# Patient Record
Sex: Female | Born: 1960
Health system: Southern US, Community
[De-identification: ages and names within clinical notes are randomized; demographics above are authoritative.]

## PROBLEM LIST (undated history)

## (undated) DIAGNOSIS — F419 Anxiety disorder, unspecified: Secondary | ICD-10-CM

## (undated) DIAGNOSIS — Z8719 Personal history of other diseases of the digestive system: Secondary | ICD-10-CM

## (undated) DIAGNOSIS — M51369 Other intervertebral disc degeneration, lumbar region without mention of lumbar back pain or lower extremity pain: Secondary | ICD-10-CM

## (undated) DIAGNOSIS — M5136 Other intervertebral disc degeneration, lumbar region: Secondary | ICD-10-CM

## (undated) DIAGNOSIS — I82409 Acute embolism and thrombosis of unspecified deep veins of unspecified lower extremity: Secondary | ICD-10-CM

## (undated) DIAGNOSIS — M1990 Unspecified osteoarthritis, unspecified site: Secondary | ICD-10-CM

## (undated) DIAGNOSIS — K589 Irritable bowel syndrome without diarrhea: Secondary | ICD-10-CM

## (undated) HISTORY — PX: ANKLE SURGERY: SHX546

## (undated) HISTORY — DX: Unspecified osteoarthritis, unspecified site: M19.90

## (undated) HISTORY — DX: Anxiety disorder, unspecified: F41.9

## (undated) HISTORY — DX: Other intervertebral disc degeneration, lumbar region: M51.36

## (undated) HISTORY — DX: Other intervertebral disc degeneration, lumbar region without mention of lumbar back pain or lower extremity pain: M51.369

## (undated) HISTORY — DX: Acute embolism and thrombosis of unspecified deep veins of unspecified lower extremity: I82.409

## (undated) HISTORY — DX: Personal history of other diseases of the digestive system: Z87.19

## (undated) HISTORY — DX: Irritable bowel syndrome, unspecified: K58.9

## (undated) HISTORY — PX: ENDOMETRIAL BIOPSY: SHX622

---

## 1999-09-01 ENCOUNTER — Other Ambulatory Visit: Admission: RE | Admit: 1999-09-01 | Discharge: 1999-09-01 | Payer: Self-pay | Admitting: Obstetrics and Gynecology

## 2001-05-31 DIAGNOSIS — I82409 Acute embolism and thrombosis of unspecified deep veins of unspecified lower extremity: Secondary | ICD-10-CM

## 2001-05-31 HISTORY — DX: Acute embolism and thrombosis of unspecified deep veins of unspecified lower extremity: I82.409

## 2002-03-09 ENCOUNTER — Encounter: Admission: RE | Admit: 2002-03-09 | Discharge: 2002-03-09 | Payer: Self-pay | Admitting: Family Medicine

## 2002-03-09 ENCOUNTER — Encounter: Payer: Self-pay | Admitting: Family Medicine

## 2002-03-25 ENCOUNTER — Emergency Department (HOSPITAL_COMMUNITY): Admission: EM | Admit: 2002-03-25 | Discharge: 2002-03-25 | Payer: Self-pay | Admitting: Emergency Medicine

## 2002-03-25 ENCOUNTER — Encounter: Payer: Self-pay | Admitting: Emergency Medicine

## 2002-03-29 ENCOUNTER — Encounter: Payer: Self-pay | Admitting: Family Medicine

## 2002-03-29 ENCOUNTER — Encounter: Admission: RE | Admit: 2002-03-29 | Discharge: 2002-03-29 | Payer: Self-pay | Admitting: Family Medicine

## 2002-04-23 ENCOUNTER — Encounter: Payer: Self-pay | Admitting: Family Medicine

## 2002-04-23 ENCOUNTER — Encounter: Admission: RE | Admit: 2002-04-23 | Discharge: 2002-04-23 | Payer: Self-pay | Admitting: Family Medicine

## 2002-10-22 ENCOUNTER — Emergency Department (HOSPITAL_COMMUNITY): Admission: EM | Admit: 2002-10-22 | Discharge: 2002-10-22 | Payer: Self-pay | Admitting: Emergency Medicine

## 2002-11-27 ENCOUNTER — Other Ambulatory Visit (HOSPITAL_COMMUNITY): Admission: RE | Admit: 2002-11-27 | Discharge: 2002-12-17 | Payer: Self-pay | Admitting: Psychiatry

## 2003-05-01 ENCOUNTER — Other Ambulatory Visit: Admission: RE | Admit: 2003-05-01 | Discharge: 2003-05-01 | Payer: Self-pay | Admitting: Obstetrics and Gynecology

## 2003-07-17 ENCOUNTER — Encounter: Admission: RE | Admit: 2003-07-17 | Discharge: 2003-07-17 | Payer: Self-pay | Admitting: Family Medicine

## 2004-06-22 ENCOUNTER — Other Ambulatory Visit: Admission: RE | Admit: 2004-06-22 | Discharge: 2004-06-22 | Payer: Self-pay | Admitting: Obstetrics and Gynecology

## 2005-02-26 ENCOUNTER — Encounter: Admission: RE | Admit: 2005-02-26 | Discharge: 2005-02-26 | Payer: Self-pay | Admitting: Family Medicine

## 2005-06-10 ENCOUNTER — Encounter: Admission: RE | Admit: 2005-06-10 | Discharge: 2005-06-10 | Payer: Self-pay | Admitting: Family Medicine

## 2006-02-17 ENCOUNTER — Ambulatory Visit (HOSPITAL_COMMUNITY): Admission: RE | Admit: 2006-02-17 | Discharge: 2006-02-17 | Payer: Self-pay | Admitting: Obstetrics and Gynecology

## 2006-02-17 ENCOUNTER — Encounter (INDEPENDENT_AMBULATORY_CARE_PROVIDER_SITE_OTHER): Payer: Self-pay | Admitting: *Deleted

## 2006-03-30 ENCOUNTER — Ambulatory Visit: Payer: Self-pay | Admitting: Family Medicine

## 2006-06-15 ENCOUNTER — Ambulatory Visit: Payer: Self-pay | Admitting: Family Medicine

## 2006-08-05 ENCOUNTER — Ambulatory Visit: Payer: Self-pay | Admitting: Family Medicine

## 2007-03-28 ENCOUNTER — Ambulatory Visit: Payer: Self-pay | Admitting: Family Medicine

## 2007-04-24 ENCOUNTER — Ambulatory Visit: Payer: Self-pay | Admitting: Family Medicine

## 2008-03-14 ENCOUNTER — Ambulatory Visit: Payer: Self-pay | Admitting: Family Medicine

## 2008-03-18 ENCOUNTER — Ambulatory Visit: Payer: Self-pay | Admitting: Family Medicine

## 2008-05-31 HISTORY — PX: OTHER SURGICAL HISTORY: SHX169

## 2009-03-06 ENCOUNTER — Ambulatory Visit: Payer: Self-pay | Admitting: Family Medicine

## 2009-04-14 ENCOUNTER — Ambulatory Visit: Payer: Self-pay | Admitting: Family Medicine

## 2010-03-03 ENCOUNTER — Ambulatory Visit: Payer: Self-pay | Admitting: Family Medicine

## 2010-06-20 ENCOUNTER — Encounter: Payer: Self-pay | Admitting: Family Medicine

## 2010-07-01 ENCOUNTER — Ambulatory Visit: Admit: 2010-07-01 | Payer: Self-pay | Admitting: Family Medicine

## 2010-07-01 ENCOUNTER — Encounter: Payer: Self-pay | Admitting: Family Medicine

## 2010-07-22 ENCOUNTER — Other Ambulatory Visit: Payer: Self-pay | Admitting: Internal Medicine

## 2010-07-22 DIAGNOSIS — R945 Abnormal results of liver function studies: Secondary | ICD-10-CM

## 2010-07-27 ENCOUNTER — Ambulatory Visit
Admission: RE | Admit: 2010-07-27 | Discharge: 2010-07-27 | Disposition: A | Discharge status: Home / Self Care | Payer: BC Managed Care – PPO | Source: Ambulatory Visit | Attending: Internal Medicine | Admitting: Internal Medicine

## 2010-07-27 DIAGNOSIS — R945 Abnormal results of liver function studies: Secondary | ICD-10-CM

## 2010-10-16 NOTE — Op Note (Signed)
[name], Karen Marks                ACCOUNT NO.:  [id number]   MEDICAL RECORD NO.:  [id number]          PATIENT TYPE:  AMB   LOCATION:  SDC                           FACILITY:  [facility]   PHYSICIAN:  Juluis Mire, M.D.   DATE OF BIRTH:  1960-11-16   DATE OF PROCEDURE:  02/17/2006  DATE OF DISCHARGE:                                 OPERATIVE REPORT   PREOPERATIVE DIAGNOSIS:  1. Abnormal uterine bleeding, right endometrial pathology.  2. Stenotic cervix.   POSTOPERATIVE DIAGNOSES:  1. Abnormal uterine bleeding, right endometrial pathology.  2. Stenotic cervix.   OPERATION/PROCEDURE:  1. Paracervical block.  2. Cervical dilatation.  3. Hysteroscopy.  4. Endometrial curetting.   SURGEON:  Juluis Mire, M.D.   ANESTHESIA:  General.   ESTIMATED BLOOD LOSS:  Minimal.   PACKS AND DRAINS:  None.   INTRAOPERATIVE BLOOD REPLACED:  None.   COMPLICATIONS:  None.   INDICATIONS:  Dictated in history and physical.   DESCRIPTION OF PROCEDURE:  The patient was taken to the OR and placed in the  supine position.  After satisfactory level of general anesthesia obtained  with the patient in the dorsal lithotomy position using the Allen stirrups.  Peritoneum and vagina were prepped out with Betadine and draped in the  sterile field.  Speculum was placed in the vaginal vault and cervix was  grasped with the single-tooth tenaculum.  Paracervical block with anesthesia  using 1% Nesacaine. Uterus was sounded to 8 cm.  Cervix was dilated to a  size 27 Pratt dilator.  Nonoperative hysteroscope was introduced into the  intrauterine cavity and distended using sorbitol. Visualization revealed  maybe a small polyp-like outgrowth. This was removed with the Randall-Stone  forceps.  The remainder of endometrium was clear and atrophic.  Endometrial  curettings were obtained.  At this point in time, the single-tooth  tenaculum and speculum were removed.  There were no signs of complications.  The patient  was taken out of the dorsal lithotomy position and once alert  and extubated, was transferred to the recovery room in good condition.  Sponge, instrument and needle counts reported as correct by the circulating  nurse x2.      Juluis Mire, M.D.  Electronically Signed     JSM/MEDQ  D:  02/17/2006  T:  02/18/2006  Job:  [id number]

## 2010-10-16 NOTE — H&P (Signed)
NAME:  Karen, Marks NO.:  [id number]   MEDICAL RECORD NO.:  [id number]          PATIENT TYPE:  AMB   LOCATION:                                FACILITY:  [facility]   PHYSICIAN:  Juluis Mire, M.D.        DATE OF BIRTH:   DATE OF ADMISSION:  DATE OF DISCHARGE:                                HISTORY & PHYSICAL   REASON FOR ADMISSION:  The patient is a 50 year old nulligravida female  presents for hysteroscopy.   HISTORY OF PRESENT ILLNESS:  The patient has been having trouble with  abnormal uterine bleeding.  We tried to do a saline infusion ultrasound.  Because of __________  cervix, we were unable to perform the evaluation.  She did have a thickened endometrium. Because of this she will be sent for  hysteroscopy to rule out endometrial pathology.   ALLERGIES:  No known drug allergies.   MEDICATIONS:  Motrin.   PAST MEDICAL HISTORY:  Diseases:  __________ .  The patient does have  history of deep venous thrombosis after a tibia fracture.  Did require  Coumadin therapy.  Workup with __________  was negative.   We are going to go ahead and treat her mini-dose heparin as well as with  compression stockings.  Otherwise no obstetrical or surgical history.   FAMILY HISTORY:  Noncontributory.   SOCIAL HISTORY:  No tobacco or alcohol use.   REVIEW OF SYSTEMS:  Noncontributory.   PHYSICAL EXAMINATION:  VITAL SIGNS: The patient is afebrile.  Stable vital  signs.  HEENT:  The patient normocephalic.  Pupils equal, round and reactive to  light and accommodation.  Extraocular movements intact.  Sclerae and  conjunctivae are clear.  Oropharynx clear.  NECK:  Without thyromegaly.  BREASTS: No discrete masses.  LUNGS:  Clear.  CARDIOVASCULAR:  Regular rate.  No murmurs or gallops.  ABDOMEN:  Benign.  No masses, organomegaly or tenderness.  PELVIC:  Normal external genitalia.  Vaginal mucosa clear.  Cervical  unremarkable.  Uterus normal in size, shape and contour.   Adnexa free of  mass or tenderness.  EXTREMITIES:  Trace edema.  NEUROLOGIC:  Grossly within normal limits.   IMPRESSION:  1. Abnormal uterine bleeding, rule out endometrial pathology.  2. Stenotic cervix.  3. History of deep venous thrombosis.   PLAN:  The patient to undergo hysteroscopy along with D&C.  The risks have  been discussed including the risk of infection.  The risk of hemorrhage  could require a transfusion with possible risk of AIDS or hepatitis.  Excessive bleeding could lead to the need for hysterectomy.  This risk of  perforation lead to injury to adjacent organs such as bladder, bowel, or  ureters that could require further exploratory surgery.  Risk of deep venous  thrombosis and pulmonary embolus.  The patient expressed understanding of  indications and risks.      Juluis Mire, M.D.  Electronically Signed     JSM/MEDQ  D:  02/17/2006  T:  02/18/2006  Job:  [id number]

## 2012-03-20 ENCOUNTER — Other Ambulatory Visit: Payer: Self-pay | Admitting: Dermatology

## 2012-07-31 ENCOUNTER — Other Ambulatory Visit: Payer: Self-pay | Admitting: Family Medicine

## 2012-07-31 DIAGNOSIS — S2231XA Fracture of one rib, right side, initial encounter for closed fracture: Secondary | ICD-10-CM

## 2012-08-01 ENCOUNTER — Other Ambulatory Visit: Payer: BC Managed Care – PPO

## 2012-08-10 ENCOUNTER — Telehealth: Payer: Self-pay | Admitting: Oncology

## 2012-08-10 NOTE — Telephone Encounter (Signed)
LVOM for pt to return call.  °

## 2012-08-14 ENCOUNTER — Telehealth: Payer: Self-pay | Admitting: Oncology

## 2012-08-14 NOTE — Telephone Encounter (Signed)
C/D 08/14/12 for appt. 08/30/12 °

## 2012-08-27 ENCOUNTER — Other Ambulatory Visit: Payer: Self-pay | Admitting: Oncology

## 2012-08-27 DIAGNOSIS — D72820 Lymphocytosis (symptomatic): Secondary | ICD-10-CM

## 2012-08-30 ENCOUNTER — Encounter: Payer: Self-pay | Admitting: Oncology

## 2012-08-30 ENCOUNTER — Ambulatory Visit (HOSPITAL_BASED_OUTPATIENT_CLINIC_OR_DEPARTMENT_OTHER): Payer: BC Managed Care – PPO | Admitting: Oncology

## 2012-08-30 ENCOUNTER — Telehealth: Payer: Self-pay | Admitting: Oncology

## 2012-08-30 ENCOUNTER — Other Ambulatory Visit (HOSPITAL_BASED_OUTPATIENT_CLINIC_OR_DEPARTMENT_OTHER): Payer: BC Managed Care – PPO | Admitting: Lab

## 2012-08-30 ENCOUNTER — Ambulatory Visit (HOSPITAL_BASED_OUTPATIENT_CLINIC_OR_DEPARTMENT_OTHER): Payer: BC Managed Care – PPO

## 2012-08-30 ENCOUNTER — Other Ambulatory Visit (HOSPITAL_COMMUNITY)
Admission: RE | Admit: 2012-08-30 | Discharge: 2012-08-30 | Disposition: A | Discharge status: Home / Self Care | Payer: BC Managed Care – PPO | Source: Ambulatory Visit | Attending: Oncology | Admitting: Oncology

## 2012-08-30 VITALS — BP 112/69 | HR 57 | Temp 97.0°F | Resp 18 | Ht 63.0 in | Wt 140.0 lb

## 2012-08-30 DIAGNOSIS — D72820 Lymphocytosis (symptomatic): Secondary | ICD-10-CM

## 2012-08-30 LAB — COMPREHENSIVE METABOLIC PANEL (CC13)
Albumin: 3.9 g/dL (ref 3.5–5.0)
BUN: 12.1 mg/dL (ref 7.0–26.0)
CO2: 28 mEq/L (ref 22–29)
Calcium: 9.6 mg/dL (ref 8.4–10.4)
Chloride: 106 mEq/L (ref 98–107)
Glucose: 90 mg/dl (ref 70–99)
Potassium: 4.2 mEq/L (ref 3.5–5.1)

## 2012-08-30 LAB — CBC WITH DIFFERENTIAL/PLATELET
Basophils Absolute: 0.1 10*3/uL (ref 0.0–0.1)
Eosinophils Absolute: 0.1 10*3/uL (ref 0.0–0.5)
HCT: 42.8 % (ref 34.8–46.6)
HGB: 14 g/dL (ref 11.6–15.9)
MCV: 87.4 fL (ref 79.5–101.0)
NEUT#: 2.3 10*3/uL (ref 1.5–6.5)
RDW: 13.9 % (ref 11.2–14.5)
lymph#: 2.6 10*3/uL (ref 0.9–3.3)

## 2012-08-30 LAB — CHCC SMEAR

## 2012-08-30 NOTE — Progress Notes (Signed)
CC:   Karen Maroon, MD  REASON FOR CONSULTATION:  Lymphocytosis.  HISTORY OF THE PRESENT ILLNESS:  This is a 52 year old woman currently of [facility] where she has lived the majority of her life.  She is a very pleasant woman with a history of anxiety, but for the most part, relatively good health and shape.  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  The patient has had some constellation of symptoms including insomnia, increasing anxiety, as well as some dizziness after exercising.  Her most recent CBC done by Dr. Selena Marks showed that her total white cell count was 5.3, but her lymphocyte percentage slightly high at 52.5, the upper limit of normal was 51.1.  Her neutrophil percentage is slightly down at 37.9. The rest of her CBC was perfectly normal.  Her absolute lymphocyte count was appeared to be normal as well.  Chemistries and liver function tests as well as urinalysis and TSH all are normal at this time.  The patient referred to me for evaluation regarding her lymphocytosis.  Clinically, she reports that she feels, other her problem with anxiety as well as her insomnia and dizziness, relatively fair.  She does not report any fevers, does not report any chills, does not report any lymphadenopathy, does not report any constitutional symptoms.  She has continued to exercise regularly without any hindrance or decline.  She had been told before that she has had this problem in the last 3 years based on her annual CBCs.  REVIEW OF SYSTEMS:  Does not report any headaches, blurry vision, double vision.  Does not report any motor or sensory neuropathy.  Does not report any alteration in mental status.  Does not report any psychiatric issues, depression.  Does not report any fevers, chills, sweats.  Does not report any cough, hemoptysis, hematemesis.  No nausea or  vomiting, abdominal pain, hematochezia, melena, genitourinary complaints.  Rest of review of systems is unremarkable.  PAST MEDICAL HISTORY:  Significant for anxiety, osteoporosis/osteopenia, irritable bowel syndrome, history of a DVT after a tibial plateau fracture.  She is status post ankle surgery.  MEDICATIONS:  She is currently on Lexapro, Celebrex, fish oil, glucosamine, Voltaren, melatonin, vitamin D, Lomotil as needed, omega-3, magnesium supplement.  ALLERGIES:  She is allergic to penicillin, amoxicillin, and sulfa.  SOCIAL HISTORY:  She is single.  She does not have any children.  Denied any alcohol or tobacco abuse.  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  FAMILY HISTORY:  Unknown, she is adopted.  She does not really know her biological parents.  PHYSICAL EXAM:  General:  Alert, awake, pleasant woman appeared in no active distress.  Vitals:  Blood pressure of 112/69, pulse is 57, respirations 18, temperature is 97, weight is 140 pounds.  ECOG performance status 0.  HEENT:  Head is normocephalic, atraumatic. Pupils equal and round, reactive to light.  Oral mucosa moist and pink. Neck:  Supple without adenopathy.  Heart:  Regular rate and rhythm, S1 and S2.  Lungs:  Clear to auscultation.  No rhonchi, wheeze, or dullness to percussion.  Abdomen:  Soft, nontender.  No hepatosplenomegaly. Extremities:  No clubbing, cyanosis, or edema.  Neurologic:  Intact motor, sensory, and deep tendon reflexes.  Lymphatics:  Did not reveal  any lymphadenopathy in the neck, axilla, or inguinal area.  LABORATORY DATA:  Hemoglobin of 14, white cell count of 5.3, platelet count of 196.  Her lymphocyte percentage is 49.9, the upper limit of normal is 49.7.  Her neutrophil percentage and count are perfectly normal.  Peripheral smear was personally reviewed  today.  I could not really appreciate any evidence of abnormal lymphocytes, no evidence of any dysplasia, no evidence of any red cell or platelet abnormalities.  ASSESSMENT AND PLAN:  This is a 52 year old woman with lymphocytosis. Her lymphocyte percentage is mildly elevated and, per her report, has been noticed in the last 3 years.  Her lymphocyte percentage today is barely above the upper limit of normal.  Differential diagnosis discussed today with Karen Marks did include reactive lymphocytosis, whether it is due to a viral infection, environmental exposure, recent illnesses, or medication.  I also talked about primary causes such as lymphoproliferative disorder, most commonly chronic lymphocytic leukemia.  I think primary causes are probably unlikely, given the chronicity of these findings and the fact that her lymphocytes have not increased.  As a matter of fact, probably decreased from previous testing.  However, for completeness, I am obtaining a blood flow cytometry to evaluate for that at this time.  If it is indeed she does have a monoclonal lymphocytosis, probably we are dealing with an early or pre chronic lymphocytic leukemia condition at worst.  At this time, I do not really see any evidence to suggest any malignancy or any advanced lymphoma at this time.  All her questions were answered today.  I will continue to monitor her and repeat her blood count in about 4 months for completeness.    ______________________________ Benjiman Core, M.D. FNS/MEDQ  D:  08/30/2012  T:  08/30/2012  Job:  [id number]

## 2012-08-30 NOTE — Progress Notes (Signed)
Checked in new patient. No financial issues. °

## 2012-08-30 NOTE — Progress Notes (Signed)
Note dictated

## 2012-09-04 LAB — FLOW CYTOMETRY

## 2012-09-12 ENCOUNTER — Encounter: Payer: Self-pay | Admitting: Oncology

## 2013-01-03 ENCOUNTER — Other Ambulatory Visit (HOSPITAL_BASED_OUTPATIENT_CLINIC_OR_DEPARTMENT_OTHER): Payer: BC Managed Care – PPO | Admitting: Lab

## 2013-01-03 ENCOUNTER — Telehealth: Payer: Self-pay | Admitting: Oncology

## 2013-01-03 ENCOUNTER — Ambulatory Visit (HOSPITAL_BASED_OUTPATIENT_CLINIC_OR_DEPARTMENT_OTHER): Payer: BC Managed Care – PPO | Admitting: Oncology

## 2013-01-03 VITALS — BP 84/54 | HR 58 | Temp 98.0°F | Resp 20 | Ht 63.0 in | Wt 140.2 lb

## 2013-01-03 DIAGNOSIS — D72820 Lymphocytosis (symptomatic): Secondary | ICD-10-CM

## 2013-01-03 LAB — COMPREHENSIVE METABOLIC PANEL (CC13)
ALT: 12 U/L (ref 0–55)
AST: 19 U/L (ref 5–34)
Alkaline Phosphatase: 60 U/L (ref 40–150)
CO2: 25 mEq/L (ref 22–29)
Creatinine: 0.9 mg/dL (ref 0.6–1.1)
Sodium: 139 mEq/L (ref 136–145)
Total Bilirubin: 0.44 mg/dL (ref 0.20–1.20)
Total Protein: 7.2 g/dL (ref 6.4–8.3)

## 2013-01-03 LAB — CBC WITH DIFFERENTIAL/PLATELET
BASO%: 1 % (ref 0.0–2.0)
EOS%: 1.5 % (ref 0.0–7.0)
Eosinophils Absolute: 0.1 10*3/uL (ref 0.0–0.5)
LYMPH%: 50.8 % — ABNORMAL HIGH (ref 14.0–49.7)
MCHC: 33.3 g/dL (ref 31.5–36.0)
MCV: 87.2 fL (ref 79.5–101.0)
MONO%: 5.3 % (ref 0.0–14.0)
NEUT#: 2.2 10*3/uL (ref 1.5–6.5)
Platelets: 189 10*3/uL (ref 145–400)
RBC: 4.9 10*6/uL (ref 3.70–5.45)
RDW: 13.9 % (ref 11.2–14.5)

## 2013-01-03 LAB — CHCC SMEAR

## 2013-01-03 NOTE — Telephone Encounter (Signed)
gv pt appt schedule for February 2015.  °

## 2013-01-03 NOTE — Progress Notes (Signed)
Hematology and Oncology Follow Up Visit  Karen Marks [id number] [birth year] 52 y.o. 01/03/2013 10:24 AM  Principle Diagnosis: 52 year old woman with lymphocytosis diagnosed in April 2014 most likely reactive in nature with her flow cytometry did not reveal a monoclonal population.  Current therapy: Observation and surveillance.  Interim History:  Karen Marks presents today for a followup visit. She is a pleasant woman I saw back in April 2014 for the evaluation for a very mild lymphocytosis. Her workup since her visit was completely unremarkable. She has not had any illnesses or hospitalizations. Has not reported any recent viral infections. She had not reported any weight loss or lymphadenopathy. She has not reported any constitutional symptoms or weight loss. She is completely asymptomatic and feels well.  Medications: I have reviewed the patient's current medications.  Current Outpatient Prescriptions  Medication Sig Dispense Refill  . ALPRAZolam (XANAX) 0.25 MG tablet Take 0.25 mg by mouth daily as needed.      . CELEBREX 200 MG capsule Take 200 mg by mouth daily.      . diphenoxylate-atropine (LOMOTIL) 2.5-0.025 MG per tablet Take 2.5 tablets by mouth daily as needed.      Marland Kitchen escitalopram (LEXAPRO) 10 MG tablet Take 10 mg by mouth daily.      . fish oil-omega-3 fatty acids 1000 MG capsule Take 100 mg by mouth daily. Wild [place] salmon oil      . TURMERIC PO Take 1 tablet by mouth daily.       No current facility-administered medications for this visit.     Allergies:  Allergies  Allergen Reactions  . Amoxicillin   . Penicillins   . Sulfa Antibiotics     Past Medical History, Surgical history, Social history, and Family History were reviewed and updated.  Review of Systems: Constitutional:  Negative for fever, chills, night sweats, anorexia, weight loss, pain. Cardiovascular: no chest pain or dyspnea on exertion Respiratory: negative Neurological: negative Dermatological:  negative ENT: negative Skin: Negative. Gastrointestinal: negative Genito-Urinary: negative Hematological and Lymphatic: negative Breast: negative Musculoskeletal: negative Remaining ROS negative. Physical Exam: Blood pressure 84/54, pulse 58, temperature 98 F (36.7 C), temperature source Oral, resp. rate 20, height 5\' 3"  (1.6 m), weight 140 lb 3.2 oz (63.594 kg). ECOG:  General appearance: alert Head: Normocephalic, without obvious abnormality, atraumatic Neck: no adenopathy, no carotid bruit, no JVD, supple, symmetrical, trachea midline and thyroid not enlarged, symmetric, no tenderness/mass/nodules Lymph nodes: Cervical, supraclavicular, and axillary nodes normal. Heart:regular rate and rhythm, S1, S2 normal, no murmur, click, rub or gallop Lung:chest clear, no wheezing, rales, normal symmetric air entry Abdomin: soft, non-tender, without masses or organomegaly EXT:no erythema, induration, or nodules   Lab Results: Lab Results  Component Value Date   WBC 5.3 01/03/2013   HGB 14.2 01/03/2013   HCT 42.7 01/03/2013   MCV 87.2 01/03/2013   PLT 189 01/03/2013     Chemistry      Component Value Date/Time   NA 143 08/30/2012 1054   K 4.2 08/30/2012 1054   CL 106 08/30/2012 1054   CO2 28 08/30/2012 1054   BUN 12.1 08/30/2012 1054   CREATININE 0.9 08/30/2012 1054      Component Value Date/Time   CALCIUM 9.6 08/30/2012 1054   ALKPHOS 62 08/30/2012 1054   AST 23 08/30/2012 1054   ALT 12 08/30/2012 1054   BILITOT 0.40 08/30/2012 1054        Impression and Plan:  52 year old woman with the lymphocytosis: Her flow cytometry did not  reveal a monoclonal population. The lymphocyte percentage today still very mildly elevated around 50% with the upper limit of normal was 49. She is completely asymptomatic without any evidence to suggest a lymphoproliferative disorder. For that time being, I will continue to observe her and repeat your bloodwork in 6 months I see no indication for a bone marrow biopsy or  further workup unless her lymphocyte percentage start to increase or she develops any specific symptoms. All her questions are answered today and she will followup in 6 months time for repeat blood work.   [facility], MD 8/6/201410:24 AM

## 2013-07-04 ENCOUNTER — Other Ambulatory Visit (HOSPITAL_BASED_OUTPATIENT_CLINIC_OR_DEPARTMENT_OTHER): Payer: BC Managed Care – PPO

## 2013-07-04 ENCOUNTER — Ambulatory Visit (HOSPITAL_BASED_OUTPATIENT_CLINIC_OR_DEPARTMENT_OTHER): Payer: BC Managed Care – PPO | Admitting: Oncology

## 2013-07-04 ENCOUNTER — Encounter: Payer: Self-pay | Admitting: Oncology

## 2013-07-04 VITALS — BP 119/60 | HR 58 | Temp 97.2°F | Resp 18 | Ht 63.0 in | Wt 139.5 lb

## 2013-07-04 DIAGNOSIS — D72820 Lymphocytosis (symptomatic): Secondary | ICD-10-CM

## 2013-07-04 LAB — CBC WITH DIFFERENTIAL/PLATELET
BASO%: 1 % (ref 0.0–2.0)
BASOS ABS: 0.1 10*3/uL (ref 0.0–0.1)
EOS ABS: 0.1 10*3/uL (ref 0.0–0.5)
EOS%: 1 % (ref 0.0–7.0)
HCT: 41.7 % (ref 34.8–46.6)
HEMOGLOBIN: 14 g/dL (ref 11.6–15.9)
LYMPH#: 2.7 10*3/uL (ref 0.9–3.3)
LYMPH%: 48.6 % (ref 14.0–49.7)
MCH: 29.4 pg (ref 25.1–34.0)
MCHC: 33.5 g/dL (ref 31.5–36.0)
MCV: 87.8 fL (ref 79.5–101.0)
MONO#: 0.3 10*3/uL (ref 0.1–0.9)
MONO%: 5.9 % (ref 0.0–14.0)
NEUT%: 43.5 % (ref 38.4–76.8)
NEUTROS ABS: 2.4 10*3/uL (ref 1.5–6.5)
Platelets: 200 10*3/uL (ref 145–400)
RBC: 4.75 10*6/uL (ref 3.70–5.45)
RDW: 13.7 % (ref 11.2–14.5)
WBC: 5.5 10*3/uL (ref 3.9–10.3)

## 2013-07-04 LAB — CHCC SMEAR

## 2013-07-04 NOTE — Progress Notes (Signed)
Hematology and Oncology Follow Up Visit  Karen Marks [id number] January 03, 1961 53 y.o. 07/04/2013 10:24 AM  Principle Diagnosis: 53 year old woman with lymphocytosis diagnosed in April 2014 most likely reactive in nature with her flow cytometry did not reveal a monoclonal population.  Current therapy: Observation and surveillance.  Interim History:  Karen Marks presents today for a followup visit. She is a pleasant woman I saw back in April 2014 for the evaluation for a very mild lymphocytosis. Her workup since her visit was completely unremarkable. She has not had any illnesses or hospitalizations. Has not reported any recent viral infections. She had not reported any weight loss or lymphadenopathy. She has not reported any constitutional symptoms or weight loss. She is completely asymptomatic and feels well. Since her last visit she continue to be asymptomatic without any major changes.  Medications: I have reviewed the patient's current medications.  Current Outpatient Prescriptions  Medication Sig Dispense Refill  . ALPRAZolam (XANAX) 0.25 MG tablet Take 0.25 mg by mouth daily as needed.      . CELEBREX 200 MG capsule Take 200 mg by mouth daily.      . diphenoxylate-atropine (LOMOTIL) 2.5-0.025 MG per tablet Take 2.5 tablets by mouth daily as needed.      Marland Kitchen escitalopram (LEXAPRO) 10 MG tablet Take 10 mg by mouth daily.      . fish oil-omega-3 fatty acids 1000 MG capsule Take 100 mg by mouth daily. Wild [place] salmon oil      . MYRBETRIQ 50 MG TB24 tablet Take 50 mg by mouth at bedtime.      . TURMERIC PO Take 1 tablet by mouth daily.       No current facility-administered medications for this visit.     Allergies:  Allergies  Allergen Reactions  . Amoxicillin   . Penicillins   . Sulfa Antibiotics     Past Medical History, Surgical history, Social history, and Family History were reviewed and updated.  Review of Systems: Constitutional:  Negative for fever, chills, night sweats,  anorexia, weight loss, pain. Cardiovascular: no chest pain or dyspnea on exertion Respiratory: negative Neurological: negative Dermatological: negative ENT: negative Skin: Negative. Gastrointestinal: negative Genito-Urinary: negative Hematological and Lymphatic: negative Breast: negative Musculoskeletal: negative Remaining ROS negative. Physical Exam: Blood pressure 119/60, pulse 58, temperature 97.2 F (36.2 C), temperature source Oral, resp. rate 18, height 5\' 3"  (1.6 m), weight 139 lb 8 oz (63.277 kg). ECOG:  General appearance: alert Head: Normocephalic, without obvious abnormality, atraumatic Neck: no adenopathy, no carotid bruit, no JVD, supple, symmetrical, trachea midline and thyroid not enlarged, symmetric, no tenderness/mass/nodules Lymph nodes: Cervical, supraclavicular, and axillary nodes normal. Heart:regular rate and rhythm, S1, S2 normal, no murmur, click, rub or gallop Lung:chest clear, no wheezing, rales, normal symmetric air entry Abdomin: soft, non-tender, without masses or organomegaly EXT:no erythema, induration, or nodules   Lab Results: Lab Results  Component Value Date   WBC 5.5 07/04/2013   HGB 14.0 07/04/2013   HCT 41.7 07/04/2013   MCV 87.8 07/04/2013   PLT 200 07/04/2013     Chemistry      Component Value Date/Time   NA 139 01/03/2013 0956   K 4.2 01/03/2013 0956   CL 106 08/30/2012 1054   CO2 25 01/03/2013 0956   BUN 11.1 01/03/2013 0956   CREATININE 0.9 01/03/2013 0956      Component Value Date/Time   CALCIUM 9.4 01/03/2013 0956   ALKPHOS 60 01/03/2013 0956   AST 19 01/03/2013 0956  ALT 12 01/03/2013 0956   BILITOT 0.44 01/03/2013 0956        Impression and Plan:  53 year old woman with the lymphocytosis: Her flow cytometry did not reveal a monoclonal population. The lymphocyte percentage today is completely normal. She is completely asymptomatic without any evidence to suggest a lymphoproliferative disorder. For that time being, I see no reason for any  further workup and I will be happy to see her in the future as needed.   [facility] LPHADAD,Jonni Oelkers, MD 2/4/201510:24 AM

## 2014-08-29 ENCOUNTER — Ambulatory Visit (INDEPENDENT_AMBULATORY_CARE_PROVIDER_SITE_OTHER): Payer: Managed Care, Other (non HMO) | Admitting: Obstetrics & Gynecology

## 2014-08-29 ENCOUNTER — Encounter: Payer: Self-pay | Admitting: Obstetrics & Gynecology

## 2014-08-29 VITALS — BP 116/68 | HR 56 | Resp 12 | Ht 64.0 in | Wt 134.0 lb

## 2014-08-29 DIAGNOSIS — Z01419 Encounter for gynecological examination (general) (routine) without abnormal findings: Secondary | ICD-10-CM | POA: Diagnosis not present

## 2014-08-29 DIAGNOSIS — Z124 Encounter for screening for malignant neoplasm of cervix: Secondary | ICD-10-CM

## 2014-08-29 DIAGNOSIS — K594 Anal spasm: Secondary | ICD-10-CM | POA: Diagnosis not present

## 2014-08-29 MED ORDER — TEMAZEPAM 15 MG PO CAPS: 15.0000 mg | ORAL_CAPSULE | Freq: Every evening | ORAL | Status: DC | PRN

## 2014-08-29 NOTE — Progress Notes (Signed)
54 y.o. G0P0000 SingleCaucasianF here for annual exam.  New patient appt today.  She is a former patient of Dr. Senaida Ores.  H/O DVT after tibial fracture.  Was on Lovenox and coumadin for six months.  This was in 2003.  Pt was never on HRT.  No vaginal bleeding in several years.  Biggest issue for pt from gyn perspective is a rectal spasm with orgasm.  This is severe and has really compromised her satisfaction with sexual activity.  Pt reports sensation of rectal pressure with severe pain when this occurs.  H/o anal fissure.  Has been prescribed lidocaine but not sure she used it appropriately.  H/O nitroglycerine use but only once as gave her a horrible headache.  Caring for aging aunt and mother.  Lots of construction on house due to remodeling.  Pt feeling a lot of stress with these issues.  Not sleeping well.  PCP:  Dr. Pearson Grippe.  Sees yearly.  Labs done yearly.  Patient's last menstrual period was 05/31/2006.          Sexually active: Yes.  and no  The current method of family planning is post menopausal status.    Exercising: Yes.    running 2-3 x week and trainer 2 x week Smoker:  no  Health Maintenance: Pap:  2014-normal History of abnormal Pap:  no MMG:  10/08/2013 3D [facility]-normal per pt Colonoscopy:  2010-repeat in 10 years Dr [address] BMD:   ? 2013-[facility] TDaP:  PCP Screening Labs: PCP, Hb today: PCP, Urine today: PCP   reports that she has never smoked. She has never used smokeless tobacco. She reports that she does not drink alcohol or use illicit drugs.  Past Medical History  Diagnosis Date  . DVT (deep venous thrombosis) 2003    had cracked tibia  . Anxiety   . IBS (irritable bowel syndrome)   . History of rectal fissure     Past Surgical History  Procedure Laterality Date  . Rectal fissure  2010  . Ankle surgery      left ankle-Dr Lajoyce Corners  . Endometrial biopsy      done at the hospital due to pain-2007    Current Outpatient Prescriptions  Medication Sig Dispense  Refill  . ALPRAZolam (XANAX) 0.25 MG tablet Take 0.25 mg by mouth daily as needed.    . diphenoxylate-atropine (LOMOTIL) 2.5-0.025 MG per tablet Take 2.5 tablets by mouth daily as needed.    Marland Kitchen escitalopram (LEXAPRO) 10 MG tablet Take 10 mg by mouth daily.    . fish oil-omega-3 fatty acids 1000 MG capsule Take 100 mg by mouth daily. Wild [place] salmon oil    . TURMERIC PO Take 1 tablet by mouth daily.    . CELEBREX 200 MG capsule Take 200 mg by mouth. As needed     No current facility-administered medications for this visit.    Family History  Problem Relation Age of Onset  . Adopted: Yes    ROS:  Pertinent items are noted in HPI.  Otherwise, a comprehensive ROS was negative.  Exam:   BP 116/68 mmHg  Pulse 56  Resp 12  Ht  (1.626 m)  Wt 134 lb (60.782 kg)  BMI 22.99 kg/m2  LMP 05/31/2006   Height:  (162.6 cm)  Ht Readings from Last 3 Encounters:  08/29/14  (1.626 m)  07/04/13  (1.6 m)  01/03/13  (1.6 m)    General appearance: alert, cooperative and appears stated age Head:  Normocephalic, without obvious abnormality, atraumatic Neck: no adenopathy, supple, symmetrical, trachea midline and thyroid normal to inspection and palpation Lungs: clear to auscultation bilaterally Breasts: normal appearance, no masses or tenderness Heart: regular rate and rhythm Abdomen: soft, non-tender; bowel sounds normal; no masses,  no organomegaly Extremities: extremities normal, atraumatic, no cyanosis or edema Skin: Skin color, texture, turgor normal. No rashes or lesions Lymph nodes: Cervical, supraclavicular, and axillary nodes normal. No abnormal inguinal nodes palpated Neurologic: Grossly normal   Pelvic: External genitalia:  no lesions              Urethra:  normal appearing urethra with no masses, tenderness or lesions              Bartholins and Skenes: normal                 Vagina: normal appearing vagina with normal color and discharge, no lesions               Cervix: no lesions              Pap taken: Yes.   Bimanual Exam:  Uterus:  normal size, contour, position, consistency, mobility, non-tender              Adnexa: normal adnexa and no mass, fullness, tenderness               Rectovaginal: Confirms               Anus:  normal sphincter tone, no lesions  Chaperone was present for exam.  A:  Well Woman with normal exam PMP, no HRT H/O DVT after tibial fracture in 2003 Proctalgia fugax Insomnia  P:   Mammogram yearly.   pap smear with HR HPV Labs with Dr. Selena BattenKim yearly Topical diltiazem 2% gel topically before intercourse.  Consider biofeedback.  Pt very busy right now with remodeling but will let me know when ready for referral as she is very interested in this. return annually or prn

## 2014-09-02 LAB — IPS PAP TEST WITH HPV

## 2014-09-03 ENCOUNTER — Telehealth: Payer: Self-pay

## 2014-09-03 NOTE — Telephone Encounter (Signed)
Patient notified of results. See telephone note.//kn 

## 2014-09-03 NOTE — Telephone Encounter (Signed)
Lmtcb//kn 

## 2014-09-03 NOTE — Telephone Encounter (Signed)
Patient is returning a call to Kelly °

## 2014-09-03 NOTE — Telephone Encounter (Signed)
Patient notified of BMD results from [facility] in 2012. Aware we will fax over an order for her to have one with her MMG this year. Patient will call to schedule both of these when she gets her reminder card. Aware they are due in 5/16//kn

## 2014-09-30 ENCOUNTER — Other Ambulatory Visit: Payer: Self-pay | Admitting: Obstetrics & Gynecology

## 2014-09-30 NOTE — Telephone Encounter (Signed)
Rx faxed to pharmacy [facility]

## 2014-09-30 NOTE — Telephone Encounter (Signed)
Medication refill request: Temazepam  Last AEX:  08/29/14 SM Next AEX:  Last MMG (if hormonal medication request): 2015 normal  Refill authorized: 08/29/14 #30caps/ 0R. Today please advise.

## 2014-10-01 ENCOUNTER — Telehealth: Payer: Self-pay | Admitting: Obstetrics & Gynecology

## 2014-10-01 NOTE — Telephone Encounter (Signed)
Patient has questions regarding her medication, last seen 08/29/14.

## 2014-10-01 NOTE — Telephone Encounter (Signed)
Spoke with Maralyn SagoSarah at [facility] who states they received faxed order for refills yesterday and prescription is ready for pick up. Advised will let the patient know. Spoke with patient and advised rx is ready for pick up. Patient is agreeable and verbalizes understanding.  Routing to provider for final review. Patient agreeable to disposition. Patient aware MD will review message and nurse will return call with any additional instructions or change of disposition. Will close encounter.

## 2014-10-01 NOTE — Telephone Encounter (Signed)
Spoke with patient. Patient states that she went to pick up rx for Restoril and was told they would need to contact our office before refilling. Patient has been out of rx for a week and was told by pharmacy they have not yet heard from our office. Advised will call [facility] directly to inquire about what is needed for patient to obtain refill. Patient is agreeable and verbalizes understanding.

## 2014-11-14 ENCOUNTER — Telehealth: Payer: Self-pay

## 2014-11-14 NOTE — Telephone Encounter (Signed)
lmtcb to discuss [facility] BMD. Results on KN's desk.//kn

## 2014-11-15 NOTE — Telephone Encounter (Signed)
Patient notified of [facility] BMD results. States did have a BMD in 2012 at [facility] and asking if there is any change, aware the previous one did show osteopenia as well. Will check with Dr Hyacinth Meeker to see if any other recommendations-she is taking Vitamin D, getting calcium in her diet, and started weight bearing exercises. Please advise.//kn

## 2014-11-15 NOTE — Telephone Encounter (Signed)
It is exactly the same as 2012.  That is good.  I do not want to do anything for treatment now, just watch.  Thanks.

## 2014-11-19 NOTE — Telephone Encounter (Signed)
Left detailed message with information, ok per DPR, on cell #. Advised to call back PRN.//kn

## 2015-04-01 ENCOUNTER — Other Ambulatory Visit: Payer: Self-pay | Admitting: Obstetrics & Gynecology

## 2015-04-01 NOTE — Telephone Encounter (Signed)
Medication refill request: Restoril  Last AEX:  [id number] Next AEX: not yet scehduled  Last MMG (if hormonal medication request): 10-17-14 WNL Refill authorized: please advise

## 2015-04-28 ENCOUNTER — Telehealth: Payer: Self-pay | Admitting: Obstetrics & Gynecology

## 2015-04-28 NOTE — Telephone Encounter (Signed)
Patient wants to talk about changing the dose on her temazepam (RESTORIL) 15 MG capsule. Patient wants to discontinue taking and wants to make sure that she does this correctly.

## 2015-04-28 NOTE — Telephone Encounter (Signed)
Ok to decrease to 7.5mg  nightly to see if will help with sleep.

## 2015-04-28 NOTE — Telephone Encounter (Signed)
Spoke with patient. Patient states that she is currently taking Temazepam 15 mg at night to help with sleep. States she started reading about it and is concerned about using it for too long. "It says it is not for long term use." Patient is asking if she may try using Temazepam 7.5 mg nightly to see if this will help. "I want to try to get on a lower dose and see if this will still be beneficial. I do not want to have to come off of it if I do not have to. It has helped so much. I don't want to be on it if it will be harmful though." Advised I will speak with Dr.Miller regarding her recommendations and return the call. Patient is agreeable.

## 2015-04-29 MED ORDER — TEMAZEPAM 7.5 MG PO CAPS: 7.5000 mg | ORAL_CAPSULE | Freq: Every evening | ORAL | Status: DC | PRN

## 2015-04-29 NOTE — Telephone Encounter (Signed)
Spoke with patient. Advised of message as seen below from Dr.Miller. Patient is agreeable. Pharmacy on file verified. Rx for Temazepam 7.5 mg take nightly as needed for sleep #30 0RF faxed [facility] at [phone] with coversheet and confirmation. Patient will return call in 1-2 weeks with an update on how she is doing with her reduced dose. Will return call sooner with any questions or concerns.  Routing to provider for final review. Patient agreeable to disposition. Will close encounter.

## 2015-04-29 NOTE — Telephone Encounter (Signed)
Order for Temazepam 7.5 mg take nightly as needed for sleep #30 0RF printed and to Dr.Miller's desk for signature.

## 2015-04-29 NOTE — Telephone Encounter (Signed)
Left message to call Aashika Carta at [phone]. 

## 2015-05-02 ENCOUNTER — Telehealth: Payer: Self-pay | Admitting: Obstetrics & Gynecology

## 2015-05-02 NOTE — Telephone Encounter (Signed)
[facility] and her [place]am is not there she said it was supposed to be sent in. Best # to reach: [phone]

## 2015-05-02 NOTE — Telephone Encounter (Signed)
Rx for Temazepam 7.5 mg #30 0RF re[facility]ed [facility] with cover sheet and confirmation. Starla Curl at front notified patient rx was re-faxed to the pharmacy with confirmation.  Routing to provider for final review. Patient agreeable to disposition. Will close encounter.

## 2015-05-13 ENCOUNTER — Telehealth: Payer: Self-pay | Admitting: Obstetrics & Gynecology

## 2015-05-13 MED ORDER — TEMAZEPAM 15 MG PO CAPS: 15.0000 mg | ORAL_CAPSULE | Freq: Every evening | ORAL | Status: DC | PRN

## 2015-05-13 NOTE — Telephone Encounter (Signed)
Spoke with patient. Patient states that she decreased her Restoril to 7.5 mg almost 2 weeks ago to see how she would do. States she is unable to sleep due to family stress related to illness. "I just think maybe it was not a good time to decrease the dose and I should try again later." Patient is requesting rx be changed back to Restoril 15 mg. Advised I will speak with Dr.Miller and return call with further recommendations. Patient is agreeable.  Dr.Miller, okay to right rx for Restoril 15 mg #30 0RF?

## 2015-05-13 NOTE — Telephone Encounter (Signed)
Patient wants to talk with the nurse. She states she is not sleeping and feels the medication is not working. Patient wants to know if she can go back to the original dosage.

## 2015-05-13 NOTE — Telephone Encounter (Signed)
Spoke with patient. Advised order for Restoril 15 mg #30 0RF [facility]. Patient is agreeable. Faxed with cover sheet and confirmation.  Will close encounter.

## 2015-05-13 NOTE — Telephone Encounter (Signed)
RX written and signed.  Ok to fax and inform pt.  Then can close encounter.

## 2015-07-10 ENCOUNTER — Other Ambulatory Visit: Payer: Self-pay | Admitting: Obstetrics & Gynecology

## 2015-07-10 NOTE — Telephone Encounter (Signed)
Medication refill request: Restoril  Last AEX:  08-29-14 Next AEX: 09-19-15 Last MMG (if hormonal medication request): 10-17-14 WNL Refill authorized: please advise

## 2015-07-15 NOTE — Telephone Encounter (Signed)
Patient calling to check on refill request

## 2015-07-15 NOTE — Telephone Encounter (Signed)
Patient is calling again for this refill. Patient is out of her prescription and has been trying to get this refilled for a week. Patient is asking if this can be taken care of today.

## 2015-07-16 NOTE — Telephone Encounter (Signed)
Please advise 

## 2015-07-16 NOTE — Telephone Encounter (Signed)
Karen Marks with [facility] pharmacy is calling for status of refill request for Restoril. Patient is out of this prescription.

## 2015-07-16 NOTE — Telephone Encounter (Signed)
Patient calling to check on the status of this refill she says it has been a week since she's taken this.

## 2015-09-09 ENCOUNTER — Telehealth: Payer: Self-pay | Admitting: Obstetrics & Gynecology

## 2015-09-09 ENCOUNTER — Encounter: Payer: Self-pay | Admitting: Obstetrics & Gynecology

## 2015-09-09 ENCOUNTER — Ambulatory Visit (INDEPENDENT_AMBULATORY_CARE_PROVIDER_SITE_OTHER): Payer: Managed Care, Other (non HMO) | Admitting: Obstetrics & Gynecology

## 2015-09-09 VITALS — BP 90/60 | HR 62 | Resp 18 | Ht 63.5 in | Wt 138.0 lb

## 2015-09-09 DIAGNOSIS — G4700 Insomnia, unspecified: Secondary | ICD-10-CM | POA: Diagnosis not present

## 2015-09-09 DIAGNOSIS — Z01419 Encounter for gynecological examination (general) (routine) without abnormal findings: Secondary | ICD-10-CM | POA: Diagnosis not present

## 2015-09-09 MED ORDER — TEMAZEPAM 15 MG PO CAPS: 15.0000 mg | ORAL_CAPSULE | Freq: Every evening | ORAL | Status: DC | PRN

## 2015-09-09 NOTE — Telephone Encounter (Signed)
Karen Marks with [facility] calling with referral appointment information. Patient is scheduled for June 14th at 1:45 with Dr. Reginia Naashewning. She is on their waitlist as well. [phone] option 1 and ask for Maralyn SagoSarah if any questions.

## 2015-09-09 NOTE — Progress Notes (Signed)
55 y.o. G0P0000 SingleCaucasianF here for annual exam.  Doing well.  Biggest issue for pt is sleep.  Uses melatonin and Temazepam.  She is using the 15mg  dosage.  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    Pt and I discussed neurology evaluation.    Pt is training for a half marathon.  Going to do the 1/2 marathon at [place].    Patient's last menstrual period was 05/31/2006.          Sexually active: Yes.    The current method of family planning is post menopausal status.    Exercising: Yes.    Run, weights with trainer Smoker:  no  Health Maintenance: Pap:  08/29/14 Neg. HR HPV:neg History of abnormal Pap:  no MMG:  10/17/14 BIRADS1:neg Colonoscopy:  2010 repeat 10 years BMD:   10/17/14 Osteopenia TDaP:  W/PCP Hep C:  Had done today Screening Labs: PCP today, Urine today: PCP   reports that she has never smoked. She has never used smokeless tobacco. She reports that she does not drink alcohol or use illicit drugs.  Past Medical History  Diagnosis Date  . DVT (deep venous thrombosis) (HCC) 2003    due to tibial fracture, Dr. Audrie Liauda's practice  . Anxiety   . IBS (irritable bowel syndrome)   . History of rectal fissure   . Arthritis   . Degenerative disc disease, lumbar   . Osteoarthritis     Past Surgical History  Procedure Laterality Date  . Rectal fissure  2010  . Ankle surgery      left ankle-Dr Lajoyce Cornersuda  . Endometrial biopsy      done at the hospital due to pain-2007    Current Outpatient Prescriptions  Medication Sig Dispense Refill  . ALPRAZolam (XANAX) 0.25 MG tablet Take 0.25 mg by mouth daily as needed.    . Ascorbic Acid (VITAMIN C) 100 MG tablet Take 100 mg by mouth daily.    Marland Kitchen. b complex vitamins tablet Take 1 tablet by mouth daily.    . CELEBREX 200 MG capsule Take 200 mg by mouth. As needed    . cholecalciferol (VITAMIN D) 1000 units tablet Take 1,000 Units by mouth daily.    . diphenoxylate-atropine (LOMOTIL) 2.5-0.025 MG per tablet  Take 2.5 tablets by mouth daily.     Tery Sanfilippo. Docusate Calcium (STOOL SOFTENER PO) Take by mouth daily as needed.    Marland Kitchen. escitalopram (LEXAPRO) 10 MG tablet Take 10 mg by mouth daily.    . fish oil-omega-3 fatty acids 1000 MG capsule Take 100 mg by mouth daily. Wild [place] salmon oil    . temazepam (RESTORIL) 15 MG capsule TAKE ONE CAPSULE AT BED- TIME AS NEEDED FOR SLEEP. 30 capsule 2  . TURMERIC PO Take 1 tablet by mouth daily.     No current facility-administered medications for this visit.    Family History  Problem Relation Age of Onset  . Adopted: Yes    ROS:  Pertinent items are noted in HPI.  Otherwise, a comprehensive ROS was negative.  Exam:   BP 90/60 mmHg  Pulse 62  Resp 18  Ht 5' 3.5" (1.613 m)  Wt 138 lb (62.596 kg)  BMI 24.06 kg/m2  LMP 05/31/2006  Weight change: +4#  Height: 5' 3.5" (161.3 cm)  Ht Readings from Last 3 Encounters:  09/09/15 5' 3.5" (1.613 m)  08/29/14 5\' 4"  (1.626 m)  07/04/13 5\' 3"  (1.6 m)    General appearance:  alert, cooperative and appears stated age Head: Normocephalic, without obvious abnormality, atraumatic Neck: no adenopathy, supple, symmetrical, trachea midline and thyroid normal to inspection and palpation Lungs: clear to auscultation bilaterally Breasts: normal appearance, no masses or tenderness Heart: regular rate and rhythm Abdomen: soft, non-tender; bowel sounds normal; no masses,  no organomegaly Extremities: extremities normal, atraumatic, no cyanosis or edema Skin: Skin color, texture, turgor normal. No rashes or lesions Lymph nodes: Cervical, supraclavicular, and axillary nodes normal. No abnormal inguinal nodes palpated Neurologic: Grossly normal   Pelvic: External genitalia:  no lesions              Urethra:  normal appearing urethra with no masses, tenderness or lesions              Bartholins and Skenes: normal                 Vagina: normal appearing vagina with normal color and discharge, no lesions               Cervix: no lesions              Pap taken: No. Bimanual Exam:  Uterus:  normal size, contour, position, consistency, mobility, non-tender              Adnexa: normal adnexa and no mass, fullness, tenderness               Rectovaginal: Confirms               Anus:  normal sphincter tone, no lesions  Chaperone was present for exam.  A:  Well Woman with normal exam PMP, no HRT H/O DVT after tibial fracture in 2003 Proctalgia fugax Insomnia, on Temazepam and takes nightly with melatonin.  Plan referral to see if any treatment option is better.  P: Mammogram yearly.  Pap smear with HR HPV 2016 Labs with Dr. Selena Batten yearly.  Has appt next week.  Blood work was done this morning.  Had Hep C testing today. Topical diltiazem 2% gel topically before intercourse. This has really helped pt.  Declined biofeedback last year.  Feels that referral is not needed due to significant improvement.  Gets [facility] at [facility].  Will let me know when needs a refill. Referral to neurology.  For now, will refill temazepam 15 mg night prn. Return annually or prn

## 2015-10-30 ENCOUNTER — Encounter: Payer: Self-pay | Admitting: Obstetrics & Gynecology

## 2015-11-24 ENCOUNTER — Other Ambulatory Visit: Payer: Self-pay | Admitting: Internal Medicine

## 2015-11-24 DIAGNOSIS — R42 Dizziness and giddiness: Secondary | ICD-10-CM

## 2015-11-24 DIAGNOSIS — M542 Cervicalgia: Secondary | ICD-10-CM

## 2015-11-24 DIAGNOSIS — R202 Paresthesia of skin: Secondary | ICD-10-CM

## 2015-11-27 ENCOUNTER — Ambulatory Visit
Admission: RE | Admit: 2015-11-27 | Discharge: 2015-11-27 | Disposition: A | Discharge status: Home / Self Care | Payer: Managed Care, Other (non HMO) | Source: Ambulatory Visit | Attending: Internal Medicine | Admitting: Internal Medicine

## 2015-11-27 DIAGNOSIS — R202 Paresthesia of skin: Secondary | ICD-10-CM

## 2015-11-27 DIAGNOSIS — R42 Dizziness and giddiness: Secondary | ICD-10-CM

## 2015-11-27 DIAGNOSIS — M542 Cervicalgia: Secondary | ICD-10-CM

## 2016-02-19 ENCOUNTER — Ambulatory Visit: Payer: Managed Care, Other (non HMO) | Admitting: Obstetrics & Gynecology

## 2016-09-20 ENCOUNTER — Ambulatory Visit: Payer: Managed Care, Other (non HMO) | Admitting: Obstetrics & Gynecology

## 2016-10-05 ENCOUNTER — Ambulatory Visit: Payer: Managed Care, Other (non HMO) | Admitting: Obstetrics & Gynecology

## 2016-10-07 ENCOUNTER — Ambulatory Visit: Payer: Managed Care, Other (non HMO) | Admitting: Obstetrics & Gynecology

## 2016-11-02 ENCOUNTER — Ambulatory Visit (INDEPENDENT_AMBULATORY_CARE_PROVIDER_SITE_OTHER): Payer: 59 | Admitting: Obstetrics & Gynecology

## 2016-11-02 ENCOUNTER — Encounter: Payer: Self-pay | Admitting: Obstetrics & Gynecology

## 2016-11-02 VITALS — BP 100/60 | HR 64 | Resp 16 | Ht 64.25 in | Wt 135.0 lb

## 2016-11-02 DIAGNOSIS — Z01419 Encounter for gynecological examination (general) (routine) without abnormal findings: Secondary | ICD-10-CM | POA: Diagnosis not present

## 2016-11-02 MED ORDER — NONFORMULARY OR COMPOUNDED ITEM: 0 refills | Status: DC

## 2016-11-02 MED ORDER — TEMAZEPAM 7.5 MG PO CAPS: 7.5000 mg | ORAL_CAPSULE | Freq: Every evening | ORAL | Status: DC | PRN

## 2016-11-02 MED ORDER — LIDOCAINE-HYDROCORTISONE ACE 3-0.5 % RE CREA: 1.0000 | TOPICAL_CREAM | Freq: Two times a day (BID) | RECTAL | 1 refills | Status: AC

## 2016-11-02 NOTE — Progress Notes (Signed)
56 y.o. G0P0000 SingleCaucasianF here for annual exam.  Doing well.  Denies vaginal bleeding.  Has been trying to wean down on the Temazepam.  She reports some increased anxiety.  Having some issues with anal fissures.  This is a chronic issue.  She has seen GI.  Needs RFs for Diltiazem and Lidocaine/Hydrocortisone.    Patient's last menstrual period was 05/31/2006.          Sexually active: No.  The current method of family planning is post menopausal status.    Exercising: Yes.    run Smoker:  no  Health Maintenance: Pap:  08/29/14 Neg. HR HPV:neg  History of abnormal Pap:  no MMG:  10/22/15 BIRADS2:Benign. Is scheduled tomorrow. Colonoscopy:  2010 f/u 10 years  BMD:   10/17/14 Osteopenia.  Scheduled tomorrow. TDaP:  Current with PCP Pneumonia vaccine(s):  No Zostavax:   No Hep C testing: Done with PCP 2017, negative Screening Labs: PCP   reports that she has never smoked. She has never used smokeless tobacco. She reports that she does not drink alcohol or use drugs.  Past Medical History:  Diagnosis Date  . Anxiety   . Arthritis   . Degenerative disc disease, lumbar   . DVT (deep venous thrombosis) (HCC) 2003   due to tibial fracture, Dr. Audrie Liauda's practice  . History of rectal fissure   . IBS (irritable bowel syndrome)   . Osteoarthritis     Past Surgical History:  Procedure Laterality Date  . ANKLE SURGERY     left ankle-Dr Lajoyce Cornersuda  . ENDOMETRIAL BIOPSY     done at the hospital due to pain-2007  . rectal fissure  2010    Current Outpatient Prescriptions  Medication Sig Dispense Refill  . ALPRAZolam (XANAX) 0.25 MG tablet Take 0.25 mg by mouth daily as needed.    . Ascorbic Acid (VITAMIN C) 100 MG tablet Take 100 mg by mouth daily.    Marland Kitchen. b complex vitamins tablet Take 1 tablet by mouth daily.    . CELEBREX 200 MG capsule Take 200 mg by mouth. As needed    . cholecalciferol (VITAMIN D) 1000 units tablet Take 1,000 Units by mouth daily.    . diphenoxylate-atropine (LOMOTIL)  2.5-0.025 MG per tablet Take 2.5 tablets by mouth daily.     Tery Sanfilippo. Docusate Calcium (STOOL SOFTENER PO) Take by mouth daily as needed.    Marland Kitchen. escitalopram (LEXAPRO) 10 MG tablet Take 15 mg by mouth daily.     . fish oil-omega-3 fatty acids 1000 MG capsule Take 100 mg by mouth daily. Wild [place] salmon oil    . temazepam (RESTORIL) 15 MG capsule Take 1 capsule (15 mg total) by mouth at bedtime as needed for sleep. (Patient taking differently: Take 7.5 mg by mouth at bedtime as needed for sleep. ) 30 capsule 5  . TURMERIC PO Take 1 tablet by mouth daily.     No current facility-administered medications for this visit.     Family History  Problem Relation Age of Onset  . Adopted: Yes    ROS:  Pertinent items are noted in HPI.  Otherwise, a comprehensive ROS was negative.  Exam:   BP 100/60 (BP Location: Right Arm, Patient Position: Sitting, Cuff Size: Normal)   Pulse 64   Resp 16   Ht 5' 4.25" (1.632 m)   Wt 135 lb (61.2 kg)   LMP 05/31/2006   BMI 22.99 kg/m   Weight change: -3#  Height: 5' 4.25" (163.2 cm)  Ht  Readings from Last 3 Encounters:  11/02/16 5' 4.25" (1.632 m)  09/09/15 5' 3.5" (1.613 m)  08/29/14 5\' 4"  (1.626 m)    General appearance: alert, cooperative and appears stated age Head: Normocephalic, without obvious abnormality, atraumatic Neck: no adenopathy, supple, symmetrical, trachea midline and thyroid normal to inspection and palpation Lungs: clear to auscultation bilaterally Breasts: normal appearance, no masses or tenderness Heart: regular rate and rhythm Abdomen: soft, non-tender; bowel sounds normal; no masses,  no organomegaly Extremities: extremities normal, atraumatic, no cyanosis or edema Skin: Skin color, texture, turgor normal. No rashes or lesions Lymph nodes: Cervical, supraclavicular, and axillary nodes normal. No abnormal inguinal nodes palpated Neurologic: Grossly normal   Pelvic: External genitalia:  no lesions              Urethra:  normal  appearing urethra with no masses, tenderness or lesions              Bartholins and Skenes: normal                 Vagina: normal appearing vagina with normal color and discharge, no lesions              Cervix: no lesions              Pap taken: No. Bimanual Exam:  Uterus:  normal size, contour, position, consistency, mobility, non-tender              Adnexa: normal adnexa and no mass, fullness, tenderness               Rectovaginal: Confirms               Anus:  normal sphincter tone, reducible hemorrhoid at 7 o'clock  Chaperone was present for exam.  A:  Well Woman with normal exam PMP, no HRT H/o DVT after tibial fracture in 2003 Proctalgia fugax Insomnia, working on tapering off of Temazepem Rectal hemorrhoids and anal fissure  P:   Mammogram guidelines reviewed pap smear with neg HR HPV 2016.  Will repeat pap and HR HPV next year Lab work UTD with Dr. Selena Batten Rx for Diltiazem 2% topically every four hours prn.  30gm/0RF Anamantel rx send to pharmacy Return annually or prn

## 2016-11-10 ENCOUNTER — Telehealth: Payer: Self-pay | Admitting: *Deleted

## 2016-11-10 NOTE — Telephone Encounter (Signed)
Faxed request received from [facility] for drug substitution for Lidocaine-HC Cream. RX is not covered by insurance. Last filled by MD on 11/02/16 at last AEX.  Please advise substitute if any.  Thank you.

## 2016-11-11 NOTE — Telephone Encounter (Signed)
Please call the pharmacy and ask if there are know substitutions that will be covered.  Also, I typically like to use an ointment on the vulva instead of a cream.  Thanks.

## 2016-11-11 NOTE — Telephone Encounter (Signed)
Spoke with pharmacy, they stated that the plan does not show a list of substitutions. Per pharmacist, plan does not cover the drug at all, switching to an ointment will not be covered as well. Please advise. Would you like me to call patient's plan?

## 2016-11-12 NOTE — Telephone Encounter (Signed)
Erskine SquibbJane at [facility] is calling regarding the Lidocaine prescription. Romilda JoyJanes is asking for an update on this request.

## 2016-11-17 NOTE — Telephone Encounter (Signed)
Spoke with [facility]. Lidocaine ointment will be the patient's cheapest option. The pharmacy will call to review cost and medication information with the patient.  Routing to provider for final review. Patient agreeable to disposition. Will close encounter.

## 2016-11-17 NOTE — Telephone Encounter (Signed)
As nothing is covered, I do not have an alternative to give.  Can the pharmacist tell us what topical lidocaine ointment or jelly is cheapest?  Thanks.

## 2016-11-19 ENCOUNTER — Encounter: Payer: Self-pay | Admitting: Obstetrics & Gynecology

## 2016-12-21 ENCOUNTER — Ambulatory Visit: Payer: Managed Care, Other (non HMO) | Admitting: Obstetrics & Gynecology

## 2017-11-14 ENCOUNTER — Ambulatory Visit: Payer: 59 | Admitting: Obstetrics & Gynecology

## 2017-11-14 ENCOUNTER — Other Ambulatory Visit: Payer: Self-pay

## 2017-11-14 ENCOUNTER — Encounter: Payer: Self-pay | Admitting: Obstetrics & Gynecology

## 2017-11-14 ENCOUNTER — Other Ambulatory Visit (HOSPITAL_COMMUNITY)
Admission: RE | Admit: 2017-11-14 | Discharge: 2017-11-14 | Disposition: A | Discharge status: Home / Self Care | Payer: 59 | Source: Ambulatory Visit | Attending: Obstetrics & Gynecology | Admitting: Obstetrics & Gynecology

## 2017-11-14 VITALS — BP 108/70 | HR 64 | Resp 16 | Ht 63.75 in | Wt 141.0 lb

## 2017-11-14 DIAGNOSIS — Z01419 Encounter for gynecological examination (general) (routine) without abnormal findings: Secondary | ICD-10-CM

## 2017-11-14 DIAGNOSIS — Z124 Encounter for screening for malignant neoplasm of cervix: Secondary | ICD-10-CM | POA: Insufficient documentation

## 2017-11-14 DIAGNOSIS — E7800 Pure hypercholesterolemia, unspecified: Secondary | ICD-10-CM | POA: Diagnosis not present

## 2017-11-14 MED ORDER — TEMAZEPAM 7.5 MG PO CAPS: 7.5000 mg | ORAL_CAPSULE | Freq: Every evening | ORAL | 5 refills | Status: DC | PRN

## 2017-11-14 MED ORDER — NONFORMULARY OR COMPOUNDED ITEM: 0 refills | Status: DC

## 2017-11-14 NOTE — Progress Notes (Signed)
57 y.o. G0P0000 SingleCaucasianF here for annual exam.  Denies vaginal bleeding.     PCP:  Dr. Selena BattenKim.  Last visit was in September of last year.  LDLs were elevated.  No treatment recommended.  Will have repeated this September.  Patient's last menstrual period was 05/31/2006.          Sexually active: No.  The current method of family planning is post menopausal status.    Exercising: Yes.    trainer 2x weekly, running, marathon ~3 miles a week  Smoker:  no  Health Maintenance: Pap:  08/29/14 neg. HR HPV:neg  History of abnormal Pap:  no MMG:  11/03/16 BIRADS1:Neg. 11/09/17 Normal.  Was notified via email.   Colonoscopy:  2010 f/u 10 years Dr. Loreta AveMann BMD:   11/03/16 osteoporosis, repeat 1-2. TDaP:  Current with PCP Pneumonia vaccine(s):  No  Shingrix:   No Hep C testing: done PCP Screening Labs: will do with Dr. Selena BattenKim in September   reports that she has never smoked. She has never used smokeless tobacco. She reports that she does not drink alcohol or use drugs.  Past Medical History:  Diagnosis Date  . Anxiety   . Arthritis   . Degenerative disc disease, lumbar   . DVT (deep venous thrombosis) (HCC) 2003   due to tibial fracture, Dr. Audrie Liauda's practice  . History of rectal fissure   . IBS (irritable bowel syndrome)   . Osteoarthritis     Past Surgical History:  Procedure Laterality Date  . ANKLE SURGERY     left ankle-Dr Lajoyce Cornersuda  . ENDOMETRIAL BIOPSY     done at the hospital due to pain-2007  . rectal fissure  2010    Current Outpatient Medications  Medication Sig Dispense Refill  . ALPRAZolam (XANAX) 0.25 MG tablet Take 0.25 mg by mouth daily as needed.    . Ascorbic Acid (VITAMIN C) 100 MG tablet Take 100 mg by mouth daily.    Marland Kitchen. b complex vitamins tablet Take 1 tablet by mouth daily.    . CELEBREX 200 MG capsule Take 200 mg by mouth. As needed    . cholecalciferol (VITAMIN D) 1000 units tablet Take 1,000 Units by mouth daily.    . diphenoxylate-atropine (LOMOTIL) 2.5-0.025 MG per  tablet Take 2.5 tablets by mouth daily.     Tery Sanfilippo. Docusate Calcium (STOOL SOFTENER PO) Take by mouth daily as needed.    Marland Kitchen. escitalopram (LEXAPRO) 10 MG tablet Take 20 mg by mouth daily.     . fish oil-omega-3 fatty acids 1000 MG capsule Take 100 mg by mouth daily. Wild [place] salmon oil    . lidocaine-hydrocortisone (ANAMANTEL HC) 3-0.5 % CREA Place 1 Applicatorful rectally 2 (two) times daily. (Patient taking differently: Place 1 Applicatorful rectally daily as needed. ) 7 g 1  . NONFORMULARY OR COMPOUNDED ITEM Diltiazem 2% gel.  Apply topically to fissure up to four times daily.  Disp:  30gm 1 each 0  . temazepam (RESTORIL) 7.5 MG capsule Take 1 capsule (7.5 mg total) by mouth at bedtime as needed for sleep.    . TURMERIC PO Take 1 tablet by mouth daily.     No current facility-administered medications for this visit.     Family History  Adopted: Yes    Review of Systems  All other systems reviewed and are negative.   Exam:   BP 108/70 (BP Location: Right Arm, Patient Position: Sitting, Cuff Size: Normal)   Pulse 64   Resp 16  Ht 5' 3.75" (1.619 m)   Wt 141 lb (64 kg)   LMP 05/31/2006   BMI 24.39 kg/m    Height: 5' 3.75" (161.9 cm)  Ht Readings from Last 3 Encounters:  11/14/17 5' 3.75" (1.619 m)  11/02/16 5' 4.25" (1.632 m)  09/09/15 5' 3.5" (1.613 m)    General appearance: alert, cooperative and appears stated age Head: Normocephalic, without obvious abnormality, atraumatic Neck: no adenopathy, supple, symmetrical, trachea midline and thyroid normal to inspection and palpation Lungs: clear to auscultation bilaterally Breasts: normal appearance, no masses or tenderness Heart: regular rate and rhythm Abdomen: soft, non-tender; bowel sounds normal; no masses,  no organomegaly Extremities: extremities normal, atraumatic, no cyanosis or edema Skin: Skin color, texture, turgor normal. No rashes or lesions Lymph nodes: Cervical, supraclavicular, and axillary nodes normal. No  abnormal inguinal nodes palpated Neurologic: Grossly normal   Pelvic: External genitalia:  no lesions              Urethra:  normal appearing urethra with no masses, tenderness or lesions              Bartholins and Skenes: normal                 Vagina: normal appearing vagina with normal color and discharge, no lesions              Cervix: no lesions              Pap taken: Yes.   Bimanual Exam:  Uterus:  normal size, contour, position, consistency, mobility, non-tender              Adnexa: no mass, fullness, tenderness               Rectovaginal: Confirms               Anus:  normal sphincter tone, no lesions  Chaperone was present for exam.  A:  Well Woman with normal exam PMP, no HRT H/O DVT after tibial fracture 2003 Proctalgia fugax Insomnia, on Temazepam Elevated cholesterol H/O anal fissure  P:   Mammogram guidelines reviewed.   pap smear obtained today Lab work will be done with Dr. Selena Batten RF for Diltiazem 2% topically every four hours prn.  30gm/oRF RX for Temazepam 7.5mg  qhs nightly.  #30/5RF. return annually or prn

## 2017-11-15 LAB — CYTOLOGY - PAP: Diagnosis: NEGATIVE

## 2017-12-20 ENCOUNTER — Encounter: Payer: Self-pay | Admitting: Obstetrics & Gynecology

## 2018-05-06 ENCOUNTER — Encounter (HOSPITAL_COMMUNITY): Payer: Self-pay | Admitting: *Deleted

## 2018-05-06 ENCOUNTER — Emergency Department (HOSPITAL_COMMUNITY)
Admission: EM | Admit: 2018-05-06 | Discharge: 2018-05-06 | Disposition: A | Discharge status: Home / Self Care | Payer: BLUE CROSS/BLUE SHIELD | Attending: Emergency Medicine | Admitting: Emergency Medicine

## 2018-05-06 ENCOUNTER — Emergency Department (HOSPITAL_COMMUNITY): Payer: BLUE CROSS/BLUE SHIELD

## 2018-05-06 ENCOUNTER — Other Ambulatory Visit: Payer: Self-pay

## 2018-05-06 DIAGNOSIS — S0990XA Unspecified injury of head, initial encounter: Secondary | ICD-10-CM | POA: Diagnosis not present

## 2018-05-06 DIAGNOSIS — M25532 Pain in left wrist: Secondary | ICD-10-CM | POA: Insufficient documentation

## 2018-05-06 DIAGNOSIS — Z79899 Other long term (current) drug therapy: Secondary | ICD-10-CM | POA: Diagnosis not present

## 2018-05-06 DIAGNOSIS — Y998 Other external cause status: Secondary | ICD-10-CM | POA: Insufficient documentation

## 2018-05-06 DIAGNOSIS — Y92008 Other place in unspecified non-institutional (private) residence as the place of occurrence of the external cause: Secondary | ICD-10-CM | POA: Diagnosis not present

## 2018-05-06 DIAGNOSIS — W19XXXA Unspecified fall, initial encounter: Secondary | ICD-10-CM

## 2018-05-06 DIAGNOSIS — Y9389 Activity, other specified: Secondary | ICD-10-CM | POA: Insufficient documentation

## 2018-05-06 MED ORDER — ONDANSETRON 8 MG PO TBDP
8.0000 mg | ORAL_TABLET | Freq: Once | ORAL | Status: AC
  Administered 2018-05-06: 8 mg via ORAL
  Filled 2018-05-06: qty 1

## 2018-05-06 MED ORDER — ONDANSETRON 8 MG PO TBDP: 8.0000 mg | ORAL_TABLET | Freq: Three times a day (TID) | ORAL | 0 refills | Status: DC | PRN
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
## 2018-06-13 ENCOUNTER — Ambulatory Visit: Payer: 59 | Admitting: Family Medicine

## 2018-11-15 ENCOUNTER — Encounter: Payer: Self-pay | Admitting: Obstetrics & Gynecology

## 2019-02-20 ENCOUNTER — Ambulatory Visit: Payer: 59 | Admitting: Obstetrics & Gynecology

## 2019-03-08 ENCOUNTER — Ambulatory Visit (INDEPENDENT_AMBULATORY_CARE_PROVIDER_SITE_OTHER): Payer: BC Managed Care – PPO | Admitting: *Deleted

## 2019-03-08 ENCOUNTER — Other Ambulatory Visit: Payer: Self-pay

## 2019-03-08 DIAGNOSIS — Z23 Encounter for immunization: Secondary | ICD-10-CM

## 2019-03-08 NOTE — Progress Notes (Signed)
Patient requested flu vacc.  Patient tolerated shot well Encounter closed

## 2019-03-14 ENCOUNTER — Other Ambulatory Visit: Payer: Self-pay

## 2019-03-14 ENCOUNTER — Encounter: Payer: Self-pay | Admitting: Obstetrics & Gynecology

## 2019-03-14 ENCOUNTER — Ambulatory Visit: Payer: BC Managed Care – PPO | Admitting: Obstetrics & Gynecology

## 2019-03-14 VITALS — BP 112/60 | HR 64 | Temp 97.2°F | Ht 63.5 in | Wt 136.0 lb

## 2019-03-14 DIAGNOSIS — Z01419 Encounter for gynecological examination (general) (routine) without abnormal findings: Secondary | ICD-10-CM

## 2019-03-14 NOTE — Progress Notes (Signed)
58 y.o. G0P0000 Single White or Caucasian female here for annual exam.  Doing well.  Denies vaginal bleeding.  Doing a lot of care for her mother.    Patient's last menstrual period was 05/31/2006.          Sexually active: No.  The current method of family planning is post menopausal status.    Exercising: Yes.    run Smoker:  no  Health Maintenance: Pap:  11/14/17 Neg   08/29/14 Neg. HR HPV:neg  History of abnormal Pap:  no MMG:  11/09/17 BIRADS1:neg. Had one in 2020. Colonoscopy:  2010 f/u 10 years.  H/o polyps at age 38. BMD:   11/03/16 Osteoporosis.  Plan to repeat next year.  Has already gone for MMG this year.    TDaP: Unsure (she will check with NP when has appt next week) Pneumonia vaccine(s):  n/a Shingrix:   No Flu vacc: Done  Hep C testing: PCP Screening Labs: PCP.  Will have this done next week.   reports that she has never smoked. She has never used smokeless tobacco. She reports that she does not drink alcohol or use drugs.  Past Medical History:  Diagnosis Date  . Anxiety   . Arthritis   . Degenerative disc disease, lumbar   . DVT (deep venous thrombosis) ([place]) 2003   due to tibial fracture, Dr. Jess Barters practice  . History of rectal fissure   . IBS (irritable bowel syndrome)   . Osteoarthritis     Past Surgical History:  Procedure Laterality Date  . ANKLE SURGERY     left ankle-Dr Sharol Given  . ENDOMETRIAL BIOPSY     done at the hospital due to pain-2007  . rectal fissure  2010    Current Outpatient Medications  Medication Sig Dispense Refill  . ALPRAZolam (XANAX) 0.5 MG tablet Take 1 tablet by mouth 2 (two) times daily as needed.    Marland Kitchen b complex vitamins tablet Take 1 tablet by mouth daily.    . CELEBREX 200 MG capsule Take 200 mg by mouth 2 (two) times daily as needed for mild pain. As needed    . cholecalciferol (VITAMIN D) 1000 units tablet Take 1,000 Units by mouth daily.    . diphenoxylate-atropine (LOMOTIL) 2.5-0.025 MG per tablet Take 2.5 tablets by mouth  daily.     Marland Kitchen escitalopram (LEXAPRO) 20 MG tablet Take 20 mg by mouth daily.    . fish oil-omega-3 fatty acids 1000 MG capsule Take 1,000 mg by mouth daily. Wild [place] salmon oil     . lidocaine-hydrocortisone (ANAMANTEL HC) 3-0.5 % CREA Place 1 Applicatorful rectally 2 (two) times daily. 7 g 1  . MAGNESIUM CARBONATE PO Take 325 mg by mouth daily.    . NONFORMULARY OR COMPOUNDED ITEM Diltiazem 2% gel.  Apply topically to fissure up to four times daily.  Disp:  30gm (Patient taking differently: Apply 1 application topically 4 (four) times daily as needed (flare up). Diltiazem 2% gel.  Apply topically to fissure up to four times daily PRN.  Disp:  30gm) 1 each 0  . temazepam (RESTORIL) 7.5 MG capsule Take 1 capsule (7.5 mg total) by mouth at bedtime as needed for sleep. (Patient taking differently: Take 7.5 mg by mouth at bedtime. ) 30 capsule 5  . TURMERIC PO Take 1 tablet by mouth daily.    . vitamin C (ASCORBIC ACID) 500 MG tablet Take 500 mg by mouth daily.     No current facility-administered medications for this visit.  Family History  Adopted: Yes    Review of Systems  All other systems reviewed and are negative.   Exam:   BP 112/60   Pulse 64   Temp (!) 97.2 F (36.2 C) (Temporal)   Ht 5' 3.5" (1.613 m)   Wt 136 lb (61.7 kg)   LMP 05/31/2006   BMI 23.71 kg/m   Height:   Height: 5' 3.5" (161.3 cm)  Ht Readings from Last 3 Encounters:  03/14/19 5' 3.5" (1.613 m)  05/06/18 5\' 4"  (1.626 m)  11/14/17 5' 3.75" (1.619 m)    General appearance: alert, cooperative and appears stated age Head: Normocephalic, without obvious abnormality, atraumatic Neck: no adenopathy, supple, symmetrical, trachea midline and thyroid normal to inspection and palpation Lungs: clear to auscultation bilaterally Breasts: normal appearance, no masses or tenderness Heart: regular rate and rhythm Abdomen: soft, non-tender; bowel sounds normal; no masses,  no organomegaly Extremities: extremities  normal, atraumatic, no cyanosis or edema Skin: Skin color, texture, turgor normal. No rashes or lesions Lymph nodes: Cervical, supraclavicular, and axillary nodes normal. No abnormal inguinal nodes palpated Neurologic: Grossly normal   Pelvic: External genitalia:  no lesions              Urethra:  normal appearing urethra with no masses, tenderness or lesions              Bartholins and Skenes: normal                 Vagina: normal appearing vagina with normal color and discharge, no lesions              Cervix: no lesions              Pap taken: No. Bimanual Exam:  Uterus:  normal size, contour, position, consistency, mobility, non-tender              Adnexa: normal adnexa and no mass, fullness, tenderness               Rectovaginal: declines  Chaperone was present for exam.  A:  Well Woman with normal exam PMP, no HRT H/o DVT after tibial fracture 2003 Proctalgia fugax Insomnia uses Temazepam H/O anal fissure  P:   Mammogram guidelines reviewed.  This was done earlier this year.  Will call [facility] for copy Plan BMD next year.  She does not want to return to have this done this year. pap smear neg with neg HR HPV 2016 07/2014.  Neg pap 2019.  Plan pap with HR HPV next year Has blood work scheduled next week Diltiazem rx not needed Aware colonoscopy is due.  She will call and schedule this with Dr. 2020. Return annually or prn

## 2019-03-14 NOTE — Patient Instructions (Signed)
Can you check and see if the prescription that is expired is lidocaine and hydrocortisone combination?  And let me know please.  Thanks.

## 2019-03-16 ENCOUNTER — Telehealth: Payer: Self-pay | Admitting: Obstetrics & Gynecology

## 2019-03-16 NOTE — Telephone Encounter (Signed)
Call to patient. Patient provided medication ingredients as seen below for refill. Patient states Dr. Sabra Heck was going to do some research to see if she could find a pharmacy to make it. Patient states this is "not urgent." RN advised would send to Dr. Sabra Heck for review and return call with recommendations. Patient agreeable.   Routing to provider for review.

## 2019-03-16 NOTE — Telephone Encounter (Signed)
Patient is calling to give ingredients for a medication  Lidocaine HCI 3% + hydrocortosone acetate 0.5%

## 2019-03-21 MED ORDER — NONFORMULARY OR COMPOUNDED ITEM: 0 refills | Status: DC

## 2019-03-21 NOTE — Telephone Encounter (Signed)
Call to patient. Message given to patient as seen below from Dr. Sabra Heck and she verbalized understanding. Patient appreciative of phone call.   Will close encounter.

## 2019-03-21 NOTE — Telephone Encounter (Signed)
Please let pt know I spoke with pharmacist at [facility].  They can make something very similar for her.  Lidocaine 3% and hydrocortisone 0.4% ointment to be applied, small amount every 6 hours as needed.  I'm not sure if what she's used before was a cream or ointment but this will be an ointment.  If cost if going to be more than $50, they will call her.  Otherwise, it will be compounded and they will call when ready to be picked up.  Please let her know and close encounter.  Thanks.

## 2019-12-12 ENCOUNTER — Telehealth: Payer: Self-pay

## 2019-12-12 NOTE — Telephone Encounter (Signed)
Patient would like to discuss recommendations reviewing bone density results.

## 2019-12-12 NOTE — Telephone Encounter (Signed)
Spoke with pt. Pt wanting results and recommendations from Dr Hyacinth Meeker on BMD.  Pt advised Dr Hyacinth Meeker not in office today and will return call when has update. Pt agreeable.   Routing to Dr Hyacinth Meeker.

## 2019-12-28 NOTE — Telephone Encounter (Signed)
Placed BMD results on Dr Rondel Baton desk for review and recommendations.

## 2019-12-28 NOTE — Telephone Encounter (Signed)
Spoke with pt. Pt given update and recommendations per Dr Hyacinth Meeker. Pt states had talked with Dr Thomasena Edis and was told to increase exercise like walking and increase her calcium. Pt states is thankful for Dr Rondel Baton response and recommendations. Pt advised to follow up with Dr Thomasena Edis for referral for treatment options/plans. Pt agreeable and verbalized understanding.   Encounter closed.

## 2019-12-28 NOTE — Telephone Encounter (Signed)
Please let Karen Marks know that Dr. Thomasena Edis ordered this and not me to typically the ordering provider gives results.  However, she does have osteopenia in Karen Marks hips and very early osteoporosis in Karen Marks spine.  Probably needs consultation to discuss treatment options/plans for trying to improve this.  Would encourage Karen Marks to follow up with ordering provider first.

## 2019-12-28 NOTE — Telephone Encounter (Signed)
Patient is calling to follow up on discussing bone density results.

## 2020-04-23 IMAGING — CR DG HAND COMPLETE 3+V*L*
3 series · 3 of 3 positions shown · non-contrast
Comparison: None.

CLINICAL DATA: Fall, hand and wrist pain

EXAM:
LEFT HAND - COMPLETE 3+ VIEW

[x hand pa left]
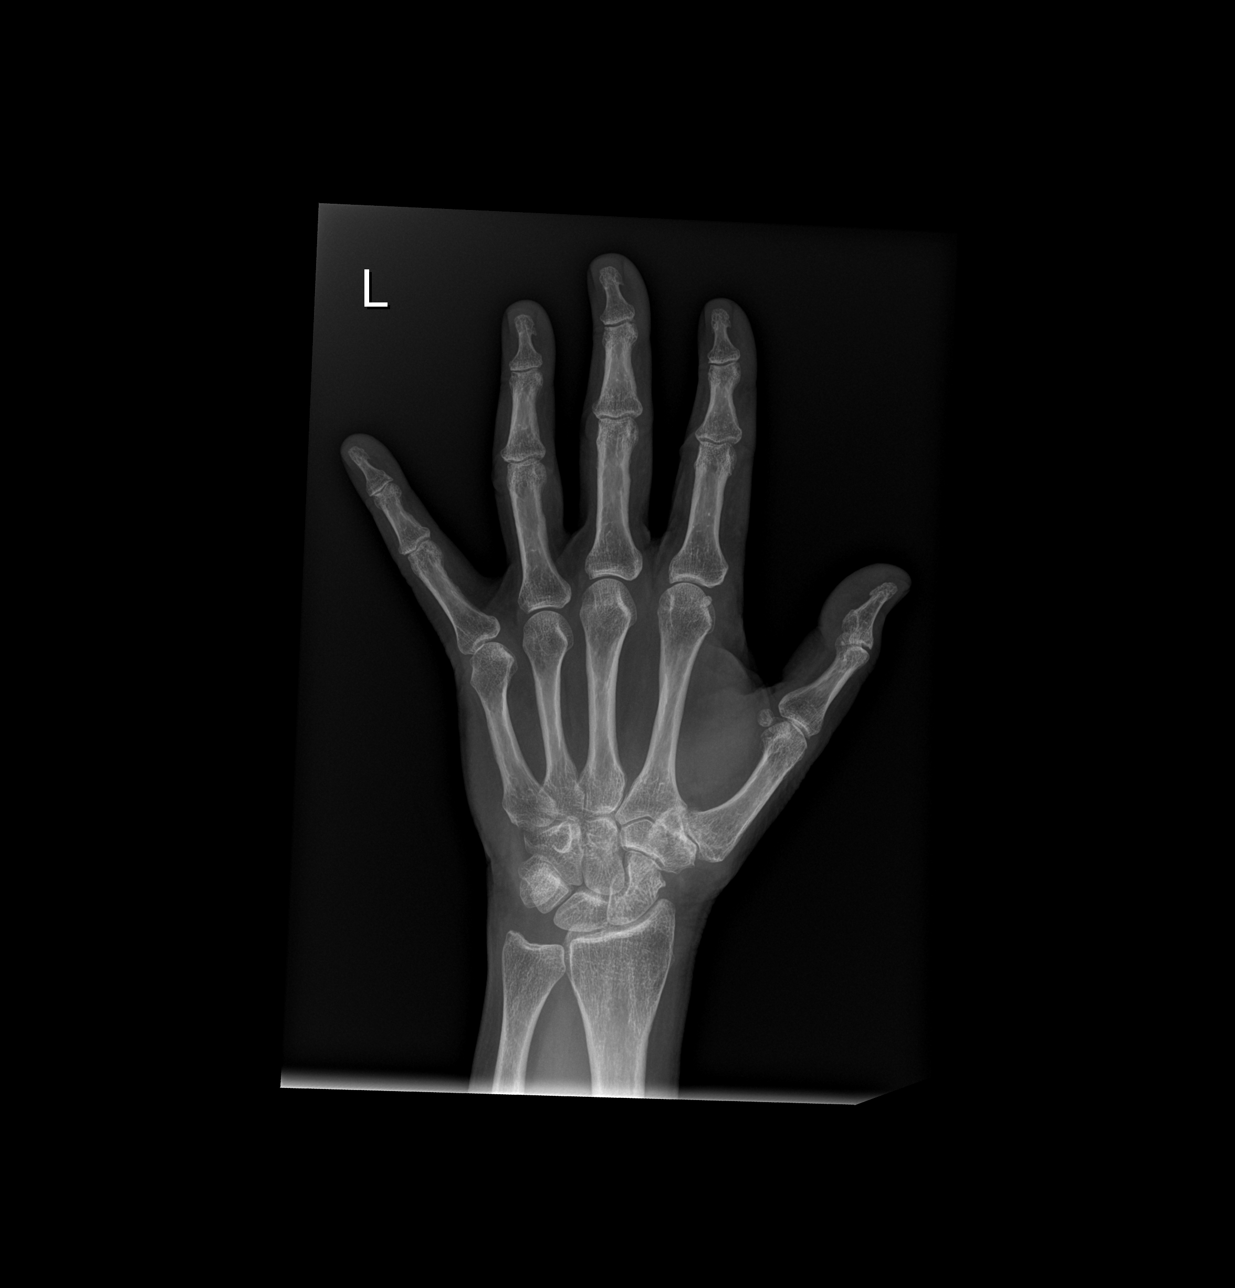

[x hand obl left]
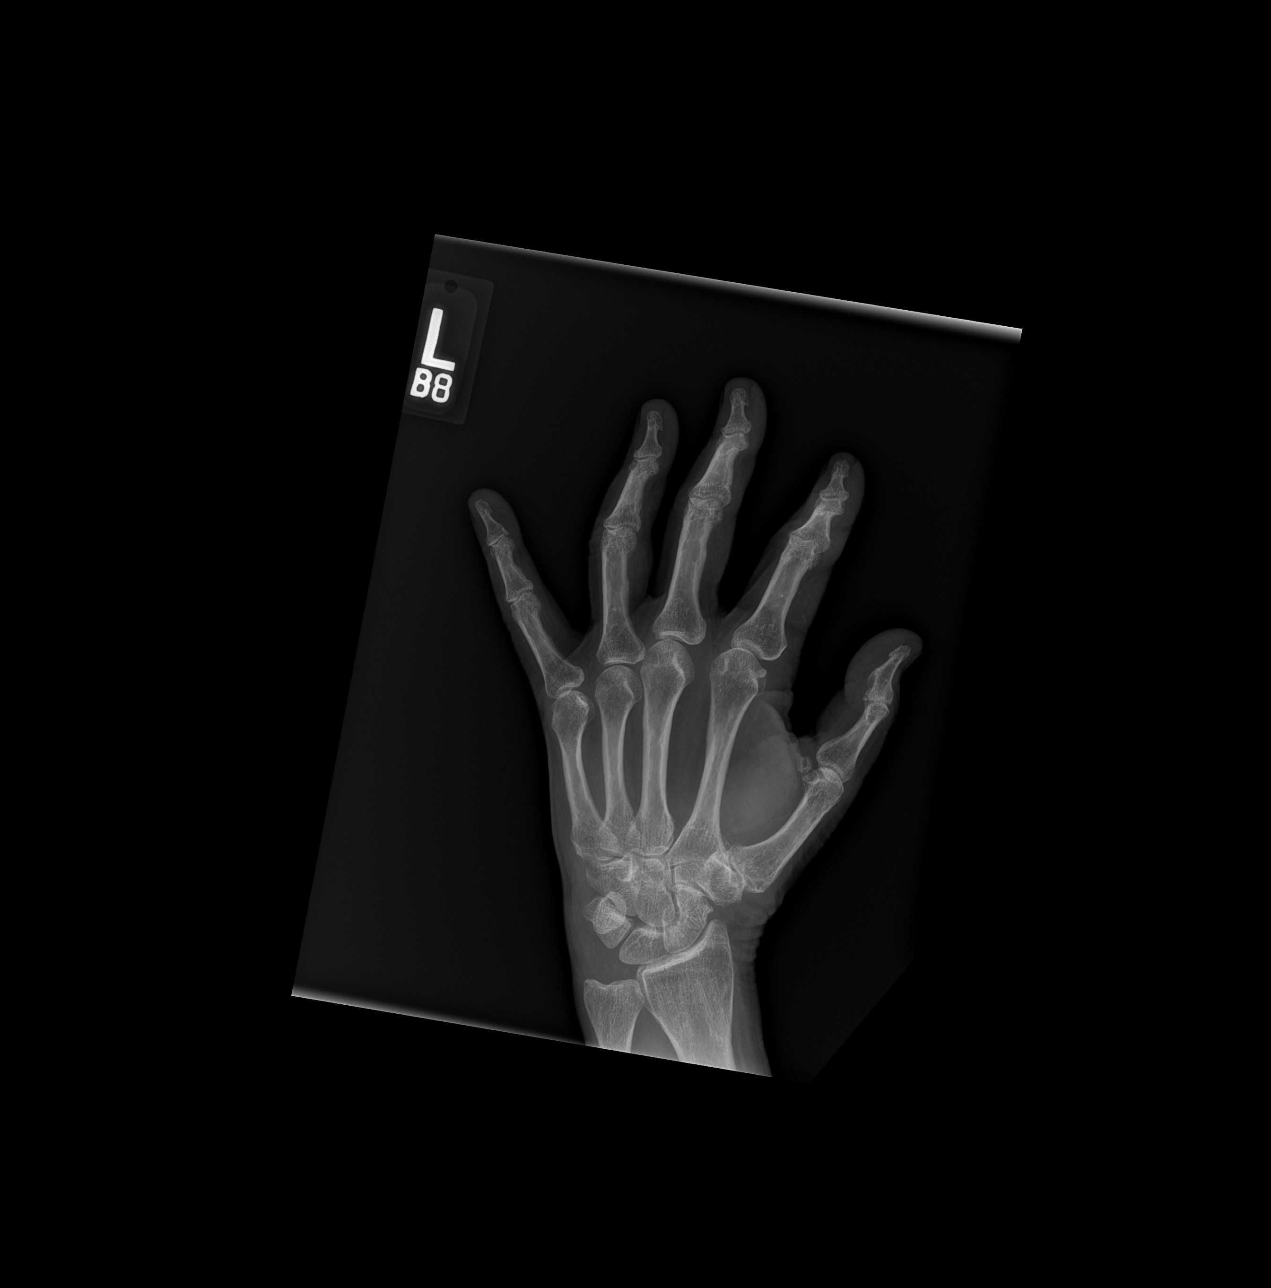

[x hand lat left]
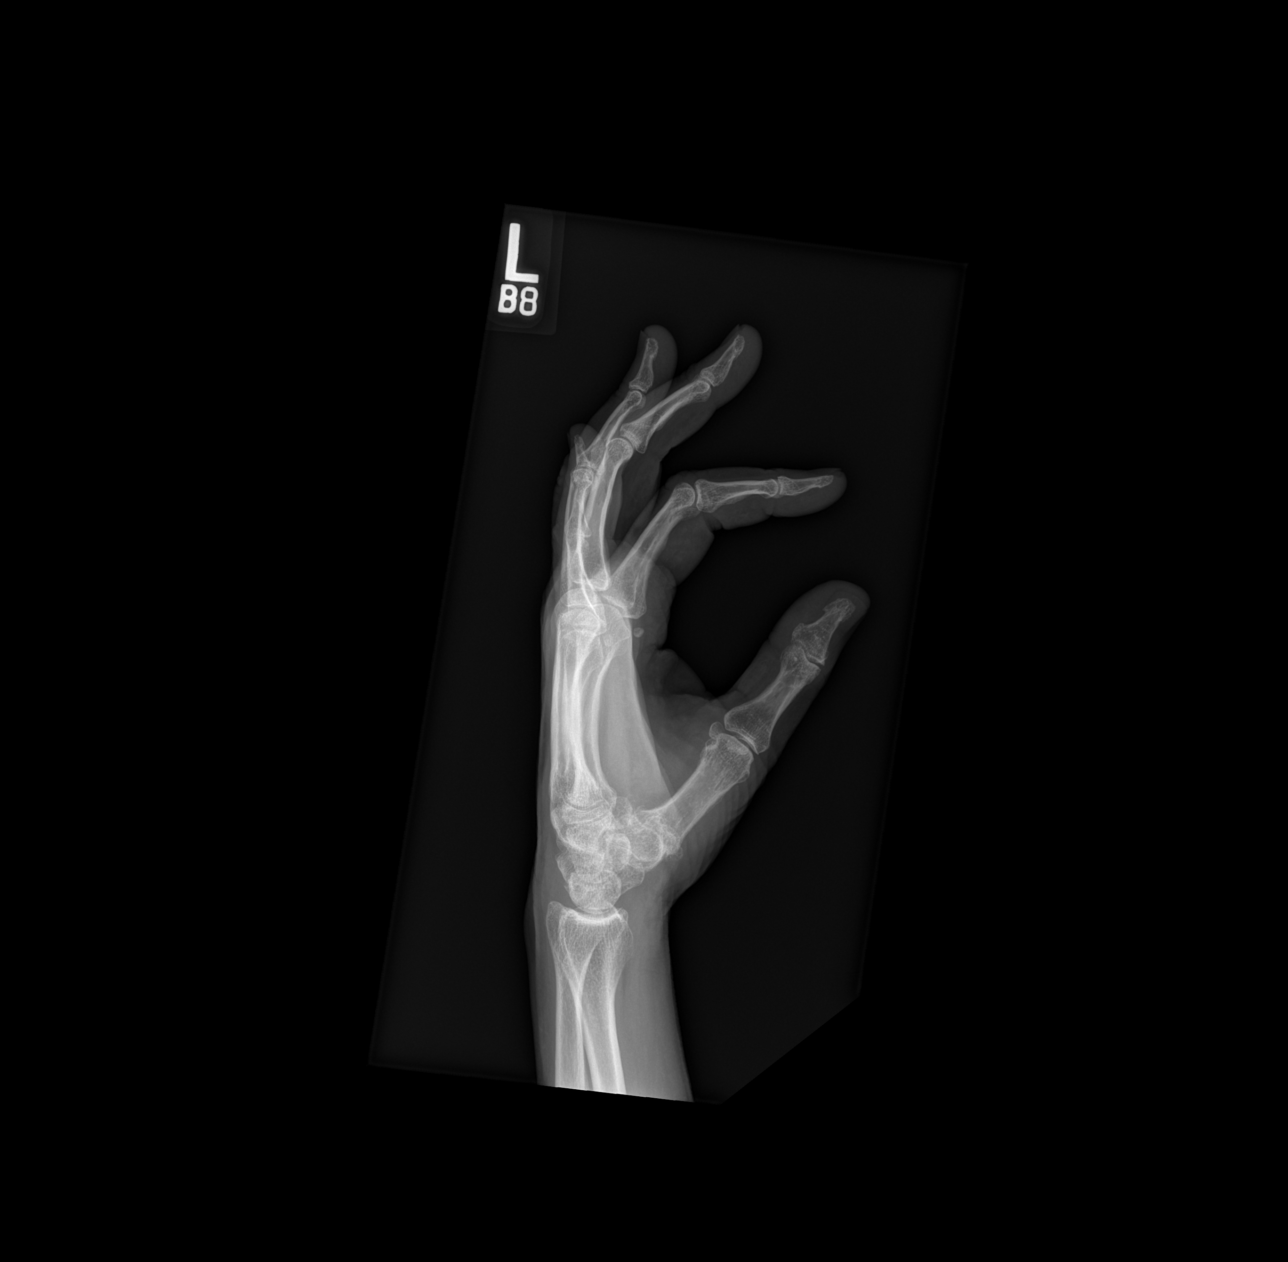

[3 of 3 positions shown; findings below may reference images not displayed]

FINDINGS: No fracture or dislocation is seen.

Mild degenerative changes of the 1st carpometacarpal joint.

The visualized soft tissues are unremarkable.
IMPRESSION: Negative.
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
## 2020-07-14 ENCOUNTER — Ambulatory Visit (INDEPENDENT_AMBULATORY_CARE_PROVIDER_SITE_OTHER): Payer: BC Managed Care – PPO | Admitting: Obstetrics & Gynecology

## 2020-07-14 ENCOUNTER — Other Ambulatory Visit: Payer: Self-pay

## 2020-07-14 ENCOUNTER — Ambulatory Visit: Payer: BC Managed Care – PPO

## 2020-07-14 ENCOUNTER — Encounter (HOSPITAL_BASED_OUTPATIENT_CLINIC_OR_DEPARTMENT_OTHER): Payer: Self-pay | Admitting: Obstetrics & Gynecology

## 2020-07-14 ENCOUNTER — Other Ambulatory Visit (HOSPITAL_COMMUNITY)
Admission: RE | Admit: 2020-07-14 | Discharge: 2020-07-14 | Disposition: A | Discharge status: Home / Self Care | Payer: BC Managed Care – PPO | Source: Ambulatory Visit | Attending: Obstetrics & Gynecology | Admitting: Obstetrics & Gynecology

## 2020-07-14 VITALS — BP 118/72 | HR 65 | Ht 63.5 in | Wt 143.6 lb

## 2020-07-14 DIAGNOSIS — M810 Age-related osteoporosis without current pathological fracture: Secondary | ICD-10-CM | POA: Insufficient documentation

## 2020-07-14 DIAGNOSIS — Z01419 Encounter for gynecological examination (general) (routine) without abnormal findings: Secondary | ICD-10-CM

## 2020-07-14 DIAGNOSIS — F5101 Primary insomnia: Secondary | ICD-10-CM

## 2020-07-14 DIAGNOSIS — K594 Anal spasm: Secondary | ICD-10-CM | POA: Diagnosis not present

## 2020-07-14 DIAGNOSIS — G4700 Insomnia, unspecified: Secondary | ICD-10-CM | POA: Insufficient documentation

## 2020-07-14 DIAGNOSIS — Z124 Encounter for screening for malignant neoplasm of cervix: Secondary | ICD-10-CM | POA: Insufficient documentation

## 2020-07-14 DIAGNOSIS — M1990 Unspecified osteoarthritis, unspecified site: Secondary | ICD-10-CM | POA: Insufficient documentation

## 2020-07-14 DIAGNOSIS — E559 Vitamin D deficiency, unspecified: Secondary | ICD-10-CM | POA: Insufficient documentation

## 2020-07-14 DIAGNOSIS — M8580 Other specified disorders of bone density and structure, unspecified site: Secondary | ICD-10-CM

## 2020-07-14 MED ORDER — TRAZODONE HCL 50 MG PO TABS: 50.0000 mg | ORAL_TABLET | Freq: Every day | ORAL | 2 refills | Status: DC

## 2020-07-14 NOTE — Progress Notes (Signed)
60 y.o. G0P0000 Single White or Caucasian female here for annual exam.  Doing well.  Mother is 69.  She helps with a lot of mother's care and finances.    Denies vaginal bleeding.  Patient's last menstrual period was 05/31/2006.          The current method of family planning is post menopausal status.     Exercising: Yes.    Smoker:  no  Health Maintenance: Pap:  11/14/17 Neg              08/29/14 Neg. HR HPV:neg  History of abnormal Pap:  no MMG: 12/31/2019 Colonoscopy:  2010 f/u 10 years. Has appt with Dr. Lowella Dandy 4/5/202.   BMD:   12/11/2019, -2.5 in spine.  Recheck 2 years.   TDaP: 07/2010 Pneumonia vaccine(s):  n/a Shingrix:   No Flu vacc: 02/2020 Hep C testing: done with Dr. Selena Batten Screening Labs: Dr. Thomasena Edis, done in the fall.   reports that she has never smoked. She has never used smokeless tobacco. She reports that she does not drink alcohol and does not use drugs.  Past Medical History:  Diagnosis Date  . Anxiety   . Arthritis   . Degenerative disc disease, lumbar   . DVT (deep venous thrombosis) ([facility]CC) 2003   due to tibial fracture, Dr. Audrie Lia practice  . History of rectal fissure   . IBS (irritable bowel syndrome)   . Osteoarthritis     Past Surgical History:  Procedure Laterality Date  . ANKLE SURGERY     left ankle-Dr Lajoyce Corners  . ENDOMETRIAL BIOPSY     done at the hospital due to pain-2007  . rectal fissure  2010    Current Outpatient Medications  Medication Sig Dispense Refill  . ALPRAZolam (XANAX) 0.5 MG tablet Take 1 tablet by mouth 2 (two) times daily as needed.    . CELEBREX 200 MG capsule Take 200 mg by mouth 2 (two) times daily as needed for mild pain. As needed    . diphenoxylate-atropine (LOMOTIL) 2.5-0.025 MG per tablet Take 2.5 tablets by mouth daily.     Marland Kitchen escitalopram (LEXAPRO) 20 MG tablet Take 20 mg by mouth daily.    . fish oil-omega-3 fatty acids 1000 MG capsule Take 1,000 mg by mouth daily. Wild [place] salmon oil    . lidocaine-hydrocortisone  (ANAMANTEL HC) 3-0.5 % CREA Place 1 Applicatorful rectally 2 (two) times daily. 7 g 1  . MAGNESIUM CARBONATE PO Take 325 mg by mouth daily.    . methylcellulose (CITRUCEL) oral powder Take by mouth daily.    . Multiple Vitamin (MULTIVITAMIN) capsule Take 1 capsule by mouth daily.    . NONFORMULARY OR COMPOUNDED ITEM Topical lidocaine 3% and hydrocortisone 0.4% topical ointment.  Apply every 4-6 hours as needed, small amount.  #30 grams. 1 each 0  . temazepam (RESTORIL) 7.5 MG capsule Take 1 capsule (7.5 mg total) by mouth at bedtime as needed for sleep. (Patient taking differently: Take 7.5 mg by mouth at bedtime.) 30 capsule 5  . TURMERIC PO Take 1 tablet by mouth daily.     No current facility-administered medications for this visit.    Family History  Adopted: Yes    Review of Systems  All other systems reviewed and are negative.   Exam:   BP 118/72 (BP Location: Right Arm, Patient Position: Sitting, Cuff Size: Normal)   Pulse 65   Ht 5' 3.5" (1.613 m)   Wt 143 lb 9.6 oz (65.1 kg)  LMP 05/31/2006   SpO2 99%   BMI 25.04 kg/m   Height: 5' 3.5" (161.3 cm)  General appearance: alert, cooperative and appears stated age Head: Normocephalic, without obvious abnormality, atraumatic Neck: no adenopathy, supple, symmetrical, trachea midline and thyroid normal to inspection and palpation Lungs: clear to auscultation bilaterally Breasts: normal appearance, no masses or tenderness Heart: regular rate and rhythm Abdomen: soft, non-tender; bowel sounds normal; no masses,  no organomegaly Extremities: extremities normal, atraumatic, no cyanosis or edema Skin: Skin color, texture, turgor normal. No rashes or lesions Lymph nodes: Cervical, supraclavicular, and axillary nodes normal. No abnormal inguinal nodes palpated Neurologic: Grossly normal   Pelvic: External genitalia:  no lesions              Urethra:  normal appearing urethra with no masses, tenderness or lesions               Bartholins and Skenes: normal                 Vagina: normal appearing vagina with normal color and discharge, no lesions              Cervix: no lesions              Pap taken: Yes.   Bimanual Exam:  Uterus:  normal size, contour, position, consistency, mobility, non-tender              Adnexa: normal adnexa and no mass, fullness, tenderness               Rectovaginal: Confirms               Anus:  normal sphincter tone, no lesions  Chaperone, Beola Cord, CMA, was present for exam.  Assessment/Plan: 1. Well woman exam with routine gynecological exam - pap with HR HPV obtained today. - MMG: 12/31/2019 - Colonoscopy appt with Dr. Loreta Ave scheduled - plan BMD 2023 - vaccines reviewed.  Tdap due.   - screening labs done in the fall  2021  2. Osteopenia, unspecified location - Plan BMD in 2 years  3. Primary insomnia - traZODone (DESYREL) 50 MG tablet; Take 1 tablet (50 mg total) by mouth at bedtime.  Dispense: 30 tablet; Refill: 2.  Serotonin syndrome discussed.  She knows to stop the medication immediately.   4. Proctalgia fugax

## 2020-07-17 LAB — CYTOLOGY - PAP
Comment: NEGATIVE
Diagnosis: NEGATIVE
High risk HPV: NEGATIVE

## 2020-10-30 ENCOUNTER — Encounter (HOSPITAL_BASED_OUTPATIENT_CLINIC_OR_DEPARTMENT_OTHER): Payer: Self-pay | Admitting: Obstetrics & Gynecology

## 2021-05-05 ENCOUNTER — Ambulatory Visit (INDEPENDENT_AMBULATORY_CARE_PROVIDER_SITE_OTHER): Payer: BC Managed Care – PPO | Admitting: Physical Medicine and Rehabilitation

## 2021-05-05 DIAGNOSIS — M79641 Pain in right hand: Secondary | ICD-10-CM

## 2021-05-05 DIAGNOSIS — M79642 Pain in left hand: Secondary | ICD-10-CM

## 2021-05-05 DIAGNOSIS — R202 Paresthesia of skin: Secondary | ICD-10-CM | POA: Diagnosis not present

## 2021-05-11 NOTE — Procedures (Signed)
EMG & NCV Findings: All nerve conduction studies (as indicated in the following tables) were within normal limits.  All left vs. right side differences were within normal limits.    All examined muscles (as indicated in the following table) showed no evidence of electrical instability.    Impression: Essentially NORMAL electrodiagnostic study of both upper limbs.  There is no significant electrodiagnostic evidence of nerve entrapment, brachial plexopathy, cervical radiculopathy or generalized peripheral neuropathy.    As you know, purely sensory or demyelinating radiculopathies and chemical radiculitis may not be detected with this particular electrodiagnostic study.  ** **This electrodiagnostic study cannot rule out small fiber polyneuropathy and dysesthesias from central pain syndromes such as stroke or central pain sensitization syndromes such as fibromyalgia.  Myotomal referral pain from trigger points is also not excluded.  Recommendations: 1.  Follow-up with referring physician. 2.  Continue current management of symptoms.  ___________________________ Naaman Plummer FAAPMR Board Certified, American Board of Physical Medicine and Rehabilitation    Nerve Conduction Studies Anti Sensory Summary Table   Stim Site NR Peak (ms) Norm Peak (ms) P-T Amp (V) Norm P-T Amp Site1 Site2 Delta-P (ms) Dist (cm) Vel (m/s) Norm Vel (m/s)  Left Median Acr Palm Anti Sensory (2nd Digit)  31C  Wrist    3.3 <3.6 27.4 >10 Wrist Palm 1.6 0.0    Palm    1.7 <2.0 31.0         Right Median Acr Palm Anti Sensory (2nd Digit)  30.5C  Wrist    3.0 <3.6 27.4 >10 Wrist Palm 1.3 0.0    Palm    1.7 <2.0 34.8         Left Radial Anti Sensory (Base 1st Digit)  31C  Wrist    2.3 <3.1 42.6  Wrist Base 1st Digit 2.3 0.0    Right Radial Anti Sensory (Base 1st Digit)  31C  Wrist    2.2 <3.1 35.9  Wrist Base 1st Digit 2.2 0.0    Left Ulnar Anti Sensory (5th Digit)  32.1C  Wrist    3.1 <3.7 15.3 >15.0 Wrist 5th  Digit 3.1 14.0 45 >38  Right Ulnar Anti Sensory (5th Digit)  31.1C  Wrist    3.1 <3.7 23.6 >15.0 Wrist 5th Digit 3.1 14.0 45 >38   Motor Summary Table   Stim Site NR Onset (ms) Norm Onset (ms) O-P Amp (mV) Norm O-P Amp Site1 Site2 Delta-0 (ms) Dist (cm) Vel (m/s) Norm Vel (m/s)  Left Median Motor (Abd Poll Brev)  31.6C  Wrist    3.0 <4.2 7.0 >5 Elbow Wrist 3.8 20.0 53 >50  Elbow    6.8  6.7         Right Median Motor (Abd Poll Brev)  31.5C  Wrist    2.9 <4.2 9.3 >5 Elbow Wrist 3.8 20.0 53 >50  Elbow    6.7  9.0         Left Ulnar Motor (Abd Dig Min)  31.9C  Wrist    2.6 <4.2 7.9 >3 B Elbow Wrist 3.0 19.0 63 >53  B Elbow    5.6  7.9  A Elbow B Elbow 1.1 10.0 91 >53  A Elbow    6.7  7.6         Right Ulnar Motor (Abd Dig Min)  31.9C  Wrist    2.7 <4.2 8.1 >3 B Elbow Wrist 2.9 20.0 69 >53  B Elbow    5.6  8.3  A Elbow B Elbow  1.3 10.0 77 >53  A Elbow    6.9  8.2          EMG   Side Muscle Nerve Root Ins Act Fibs Psw Amp Dur Poly Recrt Int Dennie Bible Comment  Right Abd Poll Brev Median C8-T1 Nml Nml Nml Nml Nml 0 Nml Nml   Right 1stDorInt Ulnar C8-T1 Nml Nml Nml Nml Nml 0 Nml Nml   Right PronatorTeres Median C6-7 Nml Nml Nml Nml Nml 0 Nml Nml   Right Biceps Musculocut C5-6 Nml Nml Nml Nml Nml 0 Nml Nml   Right Deltoid Axillary C5-6 Nml Nml Nml Nml Nml 0 Nml Nml     Nerve Conduction Studies Anti Sensory Left/Right Comparison   Stim Site L Lat (ms) R Lat (ms) L-R Lat (ms) L Amp (V) R Amp (V) L-R Amp (%) Site1 Site2 L Vel (m/s) R Vel (m/s) L-R Vel (m/s)  Median Acr Palm Anti Sensory (2nd Digit)  31C  Wrist 3.3 3.0 0.3 27.4 27.4 0.0 Wrist Palm     Palm 1.7 1.7 0.0 31.0 34.8 10.9       Radial Anti Sensory (Base 1st Digit)  31C  Wrist 2.3 2.2 0.1 42.6 35.9 15.7 Wrist Base 1st Digit     Ulnar Anti Sensory (5th Digit)  32.1C  Wrist 3.1 3.1 0.0 15.3 23.6 35.2 Wrist 5th Digit 45 45 0   Motor Left/Right Comparison   Stim Site L Lat (ms) R Lat (ms) L-R Lat (ms) L Amp (mV) R Amp (mV)  L-R Amp (%) Site1 Site2 L Vel (m/s) R Vel (m/s) L-R Vel (m/s)  Median Motor (Abd Poll Brev)  31.6C  Wrist 3.0 2.9 0.1 7.0 9.3 24.7 Elbow Wrist 53 53 0  Elbow 6.8 6.7 0.1 6.7 9.0 25.6       Ulnar Motor (Abd Dig Min)  31.9C  Wrist 2.6 2.7 0.1 7.9 8.1 2.5 B Elbow Wrist 63 69 6  B Elbow 5.6 5.6 0.0 7.9 8.3 4.8 A Elbow B Elbow 91 77 14  A Elbow 6.7 6.9 0.2 7.6 8.2 7.3          Waveforms:
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
## 2021-07-15 ENCOUNTER — Ambulatory Visit (INDEPENDENT_AMBULATORY_CARE_PROVIDER_SITE_OTHER): Payer: BC Managed Care – PPO | Admitting: Obstetrics & Gynecology

## 2021-07-15 ENCOUNTER — Encounter (HOSPITAL_BASED_OUTPATIENT_CLINIC_OR_DEPARTMENT_OTHER): Payer: Self-pay | Admitting: Obstetrics & Gynecology

## 2021-07-15 ENCOUNTER — Other Ambulatory Visit: Payer: Self-pay

## 2021-07-15 VITALS — BP 110/68 | HR 57 | Ht 64.0 in | Wt 143.0 lb

## 2021-07-15 DIAGNOSIS — K594 Anal spasm: Secondary | ICD-10-CM | POA: Diagnosis not present

## 2021-07-15 DIAGNOSIS — M810 Age-related osteoporosis without current pathological fracture: Secondary | ICD-10-CM

## 2021-07-15 DIAGNOSIS — F5101 Primary insomnia: Secondary | ICD-10-CM

## 2021-07-15 DIAGNOSIS — Z780 Asymptomatic menopausal state: Secondary | ICD-10-CM

## 2021-07-15 DIAGNOSIS — Z01419 Encounter for gynecological examination (general) (routine) without abnormal findings: Secondary | ICD-10-CM | POA: Diagnosis not present

## 2021-07-15 MED ORDER — NONFORMULARY OR COMPOUNDED ITEM: 0 refills | Status: DC

## 2021-07-15 NOTE — Progress Notes (Signed)
61 y.o. G0P0000 Single White or Caucasian female here for annual exam.  Doing well.  Mother is at [facility] and doing well.    Denies vaginal bleeding.    Will have triple cervical disc fusion.  Is planning on getting second opinion with neurosurgeon.    Patient's last menstrual period was 05/31/2006.          The current method of family planning is post menopausal status.    Exercising: Yes.     Running and toning/stretching Smoker:  no  Health Maintenance: Pap:  07/2020 neg with neg HR HPV History of abnormal Pap:  no MMG:   Colonoscopy:  10/2020 neg, follow up 10 years BMD:   11/2019, borderline osteoporosis Screening Labs: does with Dr. Prince Rome   reports that she has never smoked. She has never used smokeless tobacco. She reports that she does not drink alcohol and does not use drugs.  Past Medical History:  Diagnosis Date   Anxiety    Arthritis    Degenerative disc disease, lumbar    DVT (deep venous thrombosis) (HCC) 2003   due to tibial fracture, Dr. Audrie Lia practice   History of rectal fissure    IBS (irritable bowel syndrome)    Osteoarthritis     Past Surgical History:  Procedure Laterality Date   ANKLE SURGERY     left ankle-Dr Lajoyce Corners   ENDOMETRIAL BIOPSY     done at the hospital due to pain-2007   rectal fissure  2010    Current Outpatient Medications  Medication Sig Dispense Refill   ALPRAZolam (XANAX) 0.5 MG tablet Take 1 tablet by mouth 2 (two) times daily as needed.     CELEBREX 200 MG capsule Take 200 mg by mouth 2 (two) times daily as needed for mild pain. As needed     Cholecalciferol (VITAMIN D) 50 MCG (2000 UT) CAPS Take 50 Units by mouth 2 (two) times a week.     diphenoxylate-atropine (LOMOTIL) 2.5-0.025 MG per tablet Take 2.5 tablets by mouth daily.      escitalopram (LEXAPRO) 20 MG tablet Take 20 mg by mouth daily.     fish oil-omega-3 fatty acids 1000 MG capsule Take 1,000 mg by mouth daily. Wild [place] salmon oil     lidocaine-hydrocortisone  (ANAMANTEL HC) 3-0.5 % CREA Place 1 Applicatorful rectally 2 (two) times daily. 7 g 1   MAGNESIUM CARBONATE PO Take 325 mg by mouth daily.     methylcellulose (CITRUCEL) oral powder Take by mouth daily.     Misc Natural Products (OSTEO BI-FLEX/5-LOXIN ADVANCED PO) Take by mouth.     Multiple Vitamin (MULTIVITAMIN) capsule Take 1 capsule by mouth daily.     NONFORMULARY OR COMPOUNDED ITEM Topical lidocaine 3% and hydrocortisone 0.4% topical ointment.  Apply every 4-6 hours as needed, small amount.  #30 grams. 1 each 0   Quercetin 500 MG CAPS Take 500 mg by mouth every other day.     temazepam (RESTORIL) 7.5 MG capsule Take 1 capsule (7.5 mg total) by mouth at bedtime as needed for sleep. (Patient taking differently: Take 7.5 mg by mouth at bedtime.) 30 capsule 5   TURMERIC PO Take 1 tablet by mouth daily.     traZODone (DESYREL) 50 MG tablet Take 1 tablet (50 mg total) by mouth at bedtime. (Patient not taking: Reported on 07/15/2021) 30 tablet 2   No current facility-administered medications for this visit.    Family History  Adopted: Yes    Review of Systems  All other systems reviewed and are negative.  Exam:   BP 110/68 (BP Location: Left Arm, Patient Position: Sitting, Cuff Size: Normal)    Pulse (!) 57    Ht 5\' 4"  (1.626 m) Comment: reported   Wt 143 lb (64.9 kg)    LMP 05/31/2006    BMI 24.55 kg/m   Height: 5\' 4"  (162.6 cm) (reported)  General appearance: alert, cooperative and appears stated age Head: Normocephalic, without obvious abnormality, atraumatic Neck: no adenopathy, supple, symmetrical, trachea midline and thyroid normal to inspection and palpation Lungs: clear to auscultation bilaterally Breasts: normal appearance, no masses or tenderness Heart: regular rate and rhythm Abdomen: soft, non-tender; bowel sounds normal; no masses,  no organomegaly Extremities: extremities normal, atraumatic, no cyanosis or edema Skin: Skin color, texture, turgor normal. No rashes or  lesions Lymph nodes: Cervical, supraclavicular, and axillary nodes normal. No abnormal inguinal nodes palpated Neurologic: Grossly normal   Pelvic: External genitalia:  no lesions              Urethra:  normal appearing urethra with no masses, tenderness or lesions              Bartholins and Skenes: normal                 Vagina: normal appearing vagina with normal color and no discharge, no lesions              Cervix: no lesions              Pap taken: No. Bimanual Exam:  Uterus:  normal size, contour, position, consistency, mobility, non-tender              Adnexa: normal adnexa and no mass, fullness, tenderness              Pt declines rectal exam  Chaperone, 07/30/2006, CMA, was present for exam.  Assessment/Plan: 1. Well woman exam with routine gynecological exam - pap neg with neg HR HPV 2022 - MMG not up to date in chart.  Called for copy. - colonoscopy 2022 - BMD ordered - vaccines ordered/updated  2. Age-related osteoporosis without current pathological fracture - DG BONE DENSITY (DXA); Future  3. Proctalgia fugax - compounded lidocaine/hydrocortisone ointment will be sent to [facility]  4. Primary insomnia  5. Postmenopausal

## 2021-11-09 ENCOUNTER — Ambulatory Visit (HOSPITAL_COMMUNITY)
Admission: RE | Admit: 2021-11-09 | Discharge: 2021-11-09 | Disposition: A | Discharge status: Home / Self Care | Payer: BC Managed Care – PPO | Source: Ambulatory Visit | Attending: Family Medicine | Admitting: Family Medicine

## 2021-11-09 ENCOUNTER — Other Ambulatory Visit (HOSPITAL_COMMUNITY): Payer: Self-pay | Admitting: Family Medicine

## 2021-11-09 DIAGNOSIS — I82402 Acute embolism and thrombosis of unspecified deep veins of left lower extremity: Secondary | ICD-10-CM

## 2021-12-10 ENCOUNTER — Encounter (HOSPITAL_BASED_OUTPATIENT_CLINIC_OR_DEPARTMENT_OTHER): Payer: Self-pay | Admitting: *Deleted

## 2021-12-21 ENCOUNTER — Other Ambulatory Visit (HOSPITAL_BASED_OUTPATIENT_CLINIC_OR_DEPARTMENT_OTHER): Payer: Self-pay | Admitting: *Deleted

## 2021-12-21 ENCOUNTER — Encounter (HOSPITAL_BASED_OUTPATIENT_CLINIC_OR_DEPARTMENT_OTHER): Payer: Self-pay | Admitting: *Deleted

## 2021-12-21 DIAGNOSIS — M810 Age-related osteoporosis without current pathological fracture: Secondary | ICD-10-CM

## 2021-12-24 ENCOUNTER — Ambulatory Visit (HOSPITAL_BASED_OUTPATIENT_CLINIC_OR_DEPARTMENT_OTHER): Payer: BC Managed Care – PPO | Admitting: Obstetrics & Gynecology

## 2021-12-24 ENCOUNTER — Encounter (HOSPITAL_BASED_OUTPATIENT_CLINIC_OR_DEPARTMENT_OTHER): Payer: Self-pay | Admitting: Obstetrics & Gynecology

## 2021-12-24 VITALS — BP 100/42 | HR 53 | Ht 64.0 in | Wt 137.0 lb

## 2021-12-24 DIAGNOSIS — N951 Menopausal and female climacteric states: Secondary | ICD-10-CM

## 2021-12-24 DIAGNOSIS — R899 Unspecified abnormal finding in specimens from other organs, systems and tissues: Secondary | ICD-10-CM | POA: Diagnosis not present

## 2021-12-24 NOTE — Progress Notes (Signed)
GYNECOLOGY  VISIT  CC:   discuss hormonal results  HPI: 61 y.o. G0P0000 Single White or Caucasian female here for discussion of results obtained with PCP who is also a functional medicine provider.  Pt did send out lab work showing low estradiol, low progesterone and low testosterone levels.  As well, adrenal insufficiency noted.  Vitamins recommended for adrenal issues.  Pt wants to discuss lab values and my recommendations.  She is trying to age well and feels muscle mass is decreased.  Advised, I feel the hormonal levels are appropriate given her age and menopausal status.  These are normal for where she is in regards to her ovarian function.  I do not think HRT is the correct treatment for her given time since entering menopause.  DVT/PE, stroke, MI and breast cancer risks discussed.  She is really not interested in HRT.  She primarily wanted to discuss adrenal issues.  Advised this is best treated with endocrinology and would need serum testing to confirm diagnosis.  Right now, feels she is going to take the supplements and have testing redone before making any other decisions.  Additional questions answered.   Past Medical History:  Diagnosis Date   Anxiety    Arthritis    Degenerative disc disease, lumbar    DVT (deep venous thrombosis) (HCC) 2003   due to tibial fracture, Dr. Audrie Lia practice   History of rectal fissure    IBS (irritable bowel syndrome)    Osteoarthritis     MEDS:   Current Outpatient Medications on File Prior to Visit  Medication Sig Dispense Refill   ALPRAZolam (XANAX) 0.5 MG tablet Take 1 tablet by mouth 2 (two) times daily as needed.     CELEBREX 200 MG capsule Take 200 mg by mouth 2 (two) times daily as needed for mild pain. As needed     Cholecalciferol (VITAMIN D) 50 MCG (2000 UT) CAPS Take 50 Units by mouth 2 (two) times a week.     diphenoxylate-atropine (LOMOTIL) 2.5-0.025 MG per tablet Take 2.5 tablets by mouth daily.      escitalopram (LEXAPRO) 20 MG  tablet Take 20 mg by mouth daily.     fish oil-omega-3 fatty acids 1000 MG capsule Take 1,000 mg by mouth daily. Wild [place] salmon oil     lidocaine-hydrocortisone (ANAMANTEL HC) 3-0.5 % CREA Place 1 Applicatorful rectally 2 (two) times daily. 7 g 1   MAGNESIUM CARBONATE PO Take 325 mg by mouth daily.     methylcellulose (CITRUCEL) oral powder Take by mouth daily.     Multiple Vitamin (MULTIVITAMIN) capsule Take 1 capsule by mouth daily.     Quercetin 500 MG CAPS Take 500 mg by mouth every other day.     TURMERIC PO Take 1 tablet by mouth daily.     No current facility-administered medications on file prior to visit.    ALLERGIES: Penicillins, Sulfa antibiotics, and Amoxicillin  SH:  single, non smoker  Review of Systems  Constitutional: Negative.     PHYSICAL EXAMINATION:    BP (!) 100/42 (BP Location: Left Arm, Patient Position: Sitting, Cuff Size: Large)   Pulse (!) 53   Ht 5\' 4"  (1.626 m) Comment: Reported  Wt 137 lb (62.1 kg)   LMP 05/31/2006   BMI 23.52 kg/m     General appearance: alert, cooperative and appears stated age No other exam performed.   Assessment/Plan: 1. Menopausal state - do not feel pt should start HRT at this time.  She is  in agreement.   2. Abnormal laboratory test - she will decide if feels needs endocrinology consultation  Total time with pt:  15 minutes

## 2022-01-07 ENCOUNTER — Encounter (HOSPITAL_BASED_OUTPATIENT_CLINIC_OR_DEPARTMENT_OTHER): Payer: Self-pay | Admitting: *Deleted

## 2022-07-19 ENCOUNTER — Ambulatory Visit (HOSPITAL_BASED_OUTPATIENT_CLINIC_OR_DEPARTMENT_OTHER): Payer: BC Managed Care – PPO | Admitting: Obstetrics & Gynecology

## 2022-09-09 ENCOUNTER — Other Ambulatory Visit (HOSPITAL_COMMUNITY)
Admission: RE | Admit: 2022-09-09 | Discharge: 2022-09-09 | Disposition: A | Discharge status: Home / Self Care | Payer: BC Managed Care – PPO | Source: Ambulatory Visit | Attending: Obstetrics & Gynecology | Admitting: Obstetrics & Gynecology

## 2022-09-09 ENCOUNTER — Encounter (HOSPITAL_BASED_OUTPATIENT_CLINIC_OR_DEPARTMENT_OTHER): Payer: Self-pay | Admitting: Obstetrics & Gynecology

## 2022-09-09 ENCOUNTER — Ambulatory Visit (INDEPENDENT_AMBULATORY_CARE_PROVIDER_SITE_OTHER): Payer: BC Managed Care – PPO | Admitting: Obstetrics & Gynecology

## 2022-09-09 VITALS — BP 118/43 | HR 53 | Ht 64.0 in | Wt 138.2 lb

## 2022-09-09 DIAGNOSIS — K594 Anal spasm: Secondary | ICD-10-CM | POA: Diagnosis not present

## 2022-09-09 DIAGNOSIS — Z124 Encounter for screening for malignant neoplasm of cervix: Secondary | ICD-10-CM

## 2022-09-09 DIAGNOSIS — M810 Age-related osteoporosis without current pathological fracture: Secondary | ICD-10-CM | POA: Diagnosis not present

## 2022-09-09 DIAGNOSIS — Z780 Asymptomatic menopausal state: Secondary | ICD-10-CM

## 2022-09-09 DIAGNOSIS — Z01411 Encounter for gynecological examination (general) (routine) with abnormal findings: Secondary | ICD-10-CM

## 2022-09-09 NOTE — Progress Notes (Signed)
62 y.o. G0P0000 Single White or Caucasian female here for breast and pelvic exam.  I am also following her for history of osteoporosis.  Denies vaginal bleeding.  Has gotten married.  Sleep is better.  Spouse's father now needing care.  Denies vaginal bleeding.  Patient's last menstrual period was 05/31/2006.          H/O STD:  no  Health Maintenance: PCP:  Dr. Prince Rome.  Did blood work in July. Vaccines are up to date:  yes Colonoscopy:  10/2020.  Follow up 10 years MMG:  11/2021 BMD:  11/2021 Last pap smear:  07/2020.   H/o abnormal pap smear:  no    reports that she has never smoked. She has never used smokeless tobacco. She reports that she does not drink alcohol and does not use drugs.  Past Medical History:  Diagnosis Date   Anxiety    Arthritis    Degenerative disc disease, lumbar    DVT (deep venous thrombosis) 2003   due to tibial fracture, Dr. Audrie Lia practice   History of rectal fissure    IBS (irritable bowel syndrome)    Osteoarthritis     Past Surgical History:  Procedure Laterality Date   ANKLE SURGERY     left ankle-Dr Lajoyce Corners   ENDOMETRIAL BIOPSY     done at the hospital due to pain-2007   rectal fissure  2010    Current Outpatient Medications  Medication Sig Dispense Refill   ALPRAZolam (XANAX) 0.5 MG tablet Take 1 tablet by mouth 2 (two) times daily as needed.     CELEBREX 200 MG capsule Take 200 mg by mouth 2 (two) times daily as needed for mild pain. As needed     Cholecalciferol (VITAMIN D) 50 MCG (2000 UT) CAPS Take 50 Units by mouth 2 (two) times a week.     diphenoxylate-atropine (LOMOTIL) 2.5-0.025 MG per tablet Take 2.5 tablets by mouth daily.      escitalopram (LEXAPRO) 20 MG tablet Take 20 mg by mouth daily.     fish oil-omega-3 fatty acids 1000 MG capsule Take 1,000 mg by mouth daily. Wild [place]an salmon oil     lidocaine-hydrocortisone (ANAMANTEL HC) 3-0.5 % CREA Place 1 Applicatorful rectally 2 (two) times daily. 7 g 1   MAGNESIUM CARBONATE PO  Take 325 mg by mouth daily.     methylcellulose (CITRUCEL) oral powder Take by mouth daily.     Multiple Vitamin (MULTIVITAMIN) capsule Take 1 capsule by mouth daily.     Quercetin 500 MG CAPS Take 500 mg by mouth every other day.     TURMERIC PO Take 1 tablet by mouth daily.     No current facility-administered medications for this visit.    Family History  Adopted: Yes    Review of Systems  Constitutional: Negative.   Genitourinary: Negative.     Exam:   BP (!) 118/43 (BP Location: Right Arm, Patient Position: Sitting, Cuff Size: Normal)   Pulse (!) 53   Ht 5\' 4"  (1.626 m) Comment: reported  Wt 138 lb 3.2 oz (62.7 kg)   LMP 05/31/2006   BMI 23.72 kg/m   Height: 5\' 4"  (162.6 cm) (reported)  General appearance: alert, cooperative and appears stated age Breasts: normal appearance, no masses or tenderness Abdomen: soft, non-tender; bowel sounds normal; no masses,  no organomegaly Lymph nodes: Cervical, supraclavicular, and axillary nodes normal.  No abnormal inguinal nodes palpated Neurologic: Grossly normal  Pelvic: External genitalia:  no lesions  Urethra:  normal appearing urethra with no masses, tenderness or lesions              Bartholins and Skenes: normal                 Vagina: normal appearing vagina with atrophic changes and no discharge, no lesions              Cervix: no lesions              Pap taken: Yes.   Bimanual Exam:  Uterus:  normal size, contour, position, consistency, mobility, non-tender              Adnexa: no mass, fullness, tenderness               Rectovaginal: Confirms               Anus:  normal sphincter tone, no lesions  Chaperone, Ina Homes, CMA, was present for exam.  Assessment/Plan: 1. Encntr for gyn exam (general) (routine) w abnormal findings - Pap smear obtained today - Mammogram 11/2021 - Colonoscopy 10/2020.  Follow up 10 years. - Bone mineral density 11/2021. - lab work done with PCP, Dr. Prince Rome - vaccines  reviewed/updated  2. Postmenopausal - not on HRT  3. Age-related osteoporosis without current pathological fracture - Taking Vit D and has added magnesium.  Calcium intake discussed. - declined treatment with last year's result - will recheck next year  4. Proctalgia fugax - does not need refill at this time but will let me know when she does  5. Cervical cancer screening - Cytology - PAP( [place]) - PR OBTAINING SCREEN PAP SMEAR

## 2022-09-16 LAB — CYTOLOGY - PAP: Diagnosis: NEGATIVE

## 2023-04-26 ENCOUNTER — Other Ambulatory Visit: Payer: Self-pay | Admitting: Family Medicine

## 2023-04-26 DIAGNOSIS — E785 Hyperlipidemia, unspecified: Secondary | ICD-10-CM

## 2023-05-10 ENCOUNTER — Ambulatory Visit
Admission: RE | Admit: 2023-05-10 | Discharge: 2023-05-10 | Disposition: A | Discharge status: Home / Self Care | Payer: No Typology Code available for payment source | Source: Ambulatory Visit | Attending: Family Medicine | Admitting: Family Medicine

## 2023-05-10 DIAGNOSIS — E785 Hyperlipidemia, unspecified: Secondary | ICD-10-CM

## 2023-06-13 ENCOUNTER — Ambulatory Visit: Payer: BC Managed Care – PPO | Attending: Internal Medicine | Admitting: Internal Medicine

## 2023-06-13 ENCOUNTER — Encounter: Payer: Self-pay | Admitting: Internal Medicine

## 2023-06-13 VITALS — BP 104/70 | HR 49 | Ht 64.0 in | Wt 143.0 lb

## 2023-06-13 DIAGNOSIS — I2510 Atherosclerotic heart disease of native coronary artery without angina pectoris: Secondary | ICD-10-CM

## 2023-06-13 DIAGNOSIS — E7800 Pure hypercholesterolemia, unspecified: Secondary | ICD-10-CM

## 2023-06-13 DIAGNOSIS — M1990 Unspecified osteoarthritis, unspecified site: Secondary | ICD-10-CM | POA: Diagnosis not present

## 2023-06-13 DIAGNOSIS — I82402 Acute embolism and thrombosis of unspecified deep veins of left lower extremity: Secondary | ICD-10-CM | POA: Diagnosis not present

## 2023-06-13 DIAGNOSIS — M810 Age-related osteoporosis without current pathological fracture: Secondary | ICD-10-CM

## 2023-06-13 DIAGNOSIS — R072 Precordial pain: Secondary | ICD-10-CM

## 2023-06-13 LAB — BASIC METABOLIC PANEL
BUN/Creatinine Ratio: 10 — ABNORMAL LOW (ref 12–28)
BUN: 9 mg/dL (ref 8–27)
CO2: 23 mmol/L (ref 20–29)
Calcium: 9.8 mg/dL (ref 8.7–10.3)
Chloride: 103 mmol/L (ref 96–106)
Creatinine, Ser: 0.86 mg/dL (ref 0.57–1.00)
Glucose: 93 mg/dL (ref 70–99)
Potassium: 5.2 mmol/L (ref 3.5–5.2)
Sodium: 141 mmol/L (ref 134–144)
eGFR: 75 mL/min/{1.73_m2} (ref >59)

## 2023-06-13 NOTE — Patient Instructions (Signed)
 Medication Instructions:  Your physician recommends that you continue on your current medications as directed. Please refer to the Current Medication list given to you today.  *If you need a refill on your cardiac medications before your next appointment, please call your pharmacy*  Lab Work: BMET Today If you have labs (blood work) drawn today and your tests are completely normal, you will receive your results only by: MyChart Message (if you have MyChart) OR A paper copy in the mail If you have any lab test that is abnormal or we need to change your treatment, we will call you to review the results.   Testing/Procedures:  Your cardiac CT will be scheduled at the below location:   [facility] [address] [place], [place] [address] [phone]   If scheduled at [facility], please arrive at the [facility] and Children's Entrance (Entrance C2) of [facility] 30 minutes prior to test start time. You can use the FREE valet parking offered at entrance C (encouraged to control the heart rate for the test)  Proceed to the [facility] Radiology Department (first floor) to check-in and test prep.  All radiology patients and guests should use entrance C2 at [facility], accessed from [facility], even though the hospital's physical address listed is [address]     Please follow these instructions carefully (unless otherwise directed):  An IV will be required for this test and Nitroglycerin  will be given.   On the Night Before the Test: Be sure to Drink plenty of water. Do not consume any caffeinated/decaffeinated beverages or chocolate 12 hours prior to your test. Do not take any antihistamines 12 hours prior to your test.  On the Day of the Test: Drink plenty of water until 1 hour prior to the test. Do not eat any food 1 hour prior to test. You may take your regular medications prior to the test.  If you take  Furosemide/Hydrochlorothiazide/Spironolactone/Chlorthalidone, please HOLD on the morning of the test. Patients who wear a continuous glucose monitor MUST remove the device prior to scanning. FEMALES- please wear underwire-free bra if available, avoid dresses & tight clothing       After the Test: Drink plenty of water. After receiving IV contrast, you may experience a mild flushed feeling. This is normal. On occasion, you may experience a mild rash up to 24 hours after the test. This is not dangerous. If this occurs, you can take Benadryl 25 mg and increase your fluid intake. If you experience trouble breathing, this can be serious. If it is severe call 911 IMMEDIATELY. If it is mild, please call our office.  We will call to schedule your test 2-4 weeks out understanding that some insurance companies will need an authorization prior to the service being performed.   For more information and frequently asked questions, please visit our website : [email or web address]  For non-scheduling related questions, please contact the cardiac imaging nurse navigator should you have any questions/concerns: Cardiac Imaging Nurse Navigators Direct Office Dial: [phone]   For scheduling needs, including cancellations and rescheduling, please call Brittany, [phone].    Follow-Up: At [facility], you and your health needs are our priority.  As part of our continuing mission to provide you with exceptional heart care, we have created designated Provider Care Teams.  These Care Teams include your primary Cardiologist (physician) and Advanced Practice Providers (APPs -  Physician Assistants and Nurse Practitioners) who all work together to  provide you with the care you need, when you need it.    Your next appointment:    09/20/2023 at 9:40 am  Provider:   Gayatri A Acharya, MD

## 2023-06-13 NOTE — Progress Notes (Signed)
 Cardiology Office Note:  .   Date:  06/13/2023  ID:  Ronal FORBES Murrain, DOB [birth year], MRN [id number] PCP: Hughie Sharper, MD  [facility] Providers Cardiologist:  None    History of Present Illness: Karen Marks is a 63 y.o. female.  Discussed the use of AI scribe software for clinical note transcription with the patient, who gave verbal consent to proceed.  History of Present Illness   The patient, with a history of mild coronary artery calcifications, low resting heart rate, and a previous heart murmur, presents for a follow-up consultation. The patient reports a history of stress, anxiety, inflammation, and pain, particularly in the arms and lower back. The patient has been taking various supplements for these issues. The patient also has a history of osteoarthritis, degenerative disc disease, and osteoporosis. The patient's cholesterol levels have increased, and there is a concern about the patient's coronary artery calcium score. The patient is considering starting a bioidentical hormone treatment for post menopausal sx. The patient is also considering lifestyle changes, including diet and exercise, to manage her health conditions.        ROS: negative except per HPI above.  Studies Reviewed: Karen   EKG Interpretation Date/Time:  Monday June 13 2023 10:13:34 EST Ventricular Rate:  49 PR Interval:  170 QRS Duration:  88 QT Interval:  464 QTC Calculation: 419 R Axis:   55  Text Interpretation: Sinus bradycardia No previous ECGs available Confirmed by Loni Rushing ([id number]) on 06/13/2023 10:17:57 AM    Results   LABS LDL: 139 (2021)  RADIOLOGY Coronary artery calcium scan: Mild coronary artery calcifications in the proximal LAD. Calcium score is 38. Percentile, 73rd. (05/10/2023) images shown to patient in room.     Risk Assessment/Calculations:             Physical Exam:   VS:  BP 104/70   Pulse (!) 49   Ht 5' 4 (1.626 m)   Wt 143 lb (64.9 kg)   LMP  05/31/2006   SpO2 99%   BMI 24.55 kg/m    Wt Readings from Last 3 Encounters:  06/13/23 143 lb (64.9 kg)  09/09/22 138 lb 3.2 oz (62.7 kg)  12/24/21 137 lb (62.1 kg)     Physical Exam   CARDIOVASCULAR: No heart murmur auscultated. EKG shows bradycardia but otherwise normal.     GEN: Well nourished, well developed in no acute distress NECK: No JVD; No carotid bruits CARDIAC: bradycardic, no murmurs, rubs, gallops RESPIRATORY:  Clear to auscultation without rales, wheezing or rhonchi  ABDOMEN: Soft, non-tender, non-distended EXTREMITIES:  No edema; No deformity   ASSESSMENT AND PLAN: .    Assessment & Plan Coronary artery calcification  Precordial pain  Deep vein thrombosis (DVT) of left lower extremity, unspecified chronicity, unspecified vein (HCC)  Arthritis  Age-related osteoporosis without current pathological fracture  Elevated cholesterol  Pure hypercholesterolemia   Assessment and Plan    Coronary Artery Disease Chest pain DOE Mild coronary artery calcifications in the proximal LAD with a calcium score of 38 (73rd percentile). LDL cholesterol of 139 as of 2021, and she notes has increased since then, which is above the recommended level of 70 for individuals with plaque. Discussed the importance of reducing LDL cholesterol to prevent plaque progression and potential heart attack. -Consider starting a low dose of a statin medication to reduce cholesterol, provide mild anti-inflammatory plaque effects, and stabilize plaques. -Alternatively, consider injectable medications if statin is not tolerated or effective. -  Order a coronary CT scan to assess the extent of plaque and potential blockage in setting of chest pain on left side of chest with stress.  -Adopt a Mediterranean or plant-based diet to help reduce cholesterol and inflammation. -Return for follow-up in 3 months to assess progress and adjust treatment plan as necessary.  Shortness of Breath Experiencing  shortness of breath during the last quarter of daily walks. No chest discomfort during strenuous activities such as running a 5K or swimming. -If coronary CT scan does not reveal significant findings and shortness of breath persists, consider an echocardiogram to assess heart muscle and valve health and to evaluate for exercise-induced pressure changes.  Heart Murmur History of heart murmur in early adulthood. No murmur detected during current examination. - consider echo at future visit.

## 2023-06-23 ENCOUNTER — Telehealth: Payer: Self-pay | Admitting: Internal Medicine

## 2023-06-23 ENCOUNTER — Encounter: Payer: Self-pay | Admitting: *Deleted

## 2023-06-23 NOTE — Telephone Encounter (Signed)
Pt is requesting a callback regarding her wanting to schedule CT CORONARY MORPH W/CTA COR W/SCORE W/CA W/CM &/OR WO/CM to be done at the hospital. She stated she was contacted by a nurse Britney but missed the call. Please advise

## 2023-06-23 NOTE — Telephone Encounter (Signed)
Please see 1/23 mychart message for documentation

## 2023-06-23 NOTE — Telephone Encounter (Signed)
Patient put in contact with hospital scheduler Ginny Forth regarding CCTA

## 2023-06-30 ENCOUNTER — Encounter (HOSPITAL_COMMUNITY): Payer: Self-pay

## 2023-07-05 ENCOUNTER — Ambulatory Visit (HOSPITAL_COMMUNITY)
Admission: RE | Admit: 2023-07-05 | Discharge: 2023-07-05 | Disposition: A | Discharge status: Home / Self Care | Payer: BC Managed Care – PPO | Source: Ambulatory Visit | Attending: Internal Medicine | Admitting: Internal Medicine

## 2023-07-05 DIAGNOSIS — R072 Precordial pain: Secondary | ICD-10-CM | POA: Diagnosis present

## 2023-07-05 DIAGNOSIS — I700 Atherosclerosis of aorta: Secondary | ICD-10-CM | POA: Diagnosis not present

## 2023-07-05 DIAGNOSIS — I2510 Atherosclerotic heart disease of native coronary artery without angina pectoris: Secondary | ICD-10-CM | POA: Insufficient documentation

## 2023-07-05 MED ORDER — NITROGLYCERIN 0.4 MG SL SUBL
0.8000 mg | SUBLINGUAL_TABLET | Freq: Once | SUBLINGUAL | Status: AC
  Administered 2023-07-05: 0.8 mg via SUBLINGUAL

## 2023-07-05 MED ORDER — DILTIAZEM HCL 25 MG/5ML IV SOLN: 10.0000 mg | INTRAVENOUS | Status: DC | PRN

## 2023-07-05 MED ORDER — NITROGLYCERIN 0.4 MG SL SUBL
SUBLINGUAL_TABLET | SUBLINGUAL | Status: AC
  Filled 2023-07-05: qty 2

## 2023-07-05 MED ORDER — IOHEXOL 350 MG/ML SOLN
95.0000 mL | Freq: Once | INTRAVENOUS | Status: AC | PRN
  Administered 2023-07-05: 95 mL via INTRAVENOUS

## 2023-07-05 MED ORDER — METOPROLOL TARTRATE 5 MG/5ML IV SOLN: 10.0000 mg | Freq: Once | INTRAVENOUS | Status: DC | PRN

## 2023-07-05 NOTE — Progress Notes (Signed)
MD Crenshaw called by this RN and aware of SBP below 110, pt remains asymptomatic. MD stated comfortable proceeding with scan.

## 2023-09-20 ENCOUNTER — Ambulatory Visit: Payer: BC Managed Care – PPO | Attending: Internal Medicine | Admitting: Internal Medicine

## 2023-09-20 ENCOUNTER — Encounter: Payer: Self-pay | Admitting: Internal Medicine

## 2023-09-20 VITALS — BP 94/70 | HR 55 | Ht 64.0 in | Wt 137.6 lb

## 2023-09-20 DIAGNOSIS — Q2112 Patent foramen ovale: Secondary | ICD-10-CM

## 2023-09-20 DIAGNOSIS — K580 Irritable bowel syndrome with diarrhea: Secondary | ICD-10-CM | POA: Diagnosis present

## 2023-09-20 DIAGNOSIS — E7800 Pure hypercholesterolemia, unspecified: Secondary | ICD-10-CM | POA: Diagnosis not present

## 2023-09-20 DIAGNOSIS — I2510 Atherosclerotic heart disease of native coronary artery without angina pectoris: Secondary | ICD-10-CM

## 2023-09-20 NOTE — Progress Notes (Signed)
 Cardiology Office Note:  .   Date:  09/20/2023  ID:  [name], DOB [birth year], MRN [id number] PCP: Karen Catholic, MD  [facility] Providers Cardiologist:  Euell Herrlich, MD    History of Present Illness: Karen Marks   Karen Marks is a 63 y.o. female.  Discussed the use of AI scribe software for clinical note transcription with the patient, who gave verbal consent to proceed.  History of Present Illness The patient, with a history of IBS, reports a change in her upper GI symptoms starting from the previous fall. Despite a change in medication from Lomotil to a new medication, the patient's symptoms did not improve. The patient also has a history of chest discomfort and shortness of breath, which have improved significantly. The patient is physically active, currently training for a 10K run. The patient has recently started hormone replacement therapy and is trying new medications for her GI issues. The patient's cholesterol levels are slightly elevated, and she is considering dietary changes to help manage these levels.    ROS: negative except per HPI above.  Studies Reviewed: Karen Marks   EKG Interpretation Date/Time:  Tuesday September 20 2023 09:52:19 EDT Ventricular Rate:  55 PR Interval:  150 QRS Duration:  84 QT Interval:  428 QTC Calculation: 409 R Axis:   22  Text Interpretation: Sinus bradycardia When compared with ECG of 13-Jun-2023 10:13, No significant change was found Confirmed by Grady Lawman ([id number]) on 09/20/2023 10:14:21 AM    Results LABS TSH: 2.1 (04/26/2023) Hemoglobin: normal (04/26/2023) Hematocrit: normal (04/26/2023) Platelets: normal (04/26/2023) Creatinine: 0.86 (04/26/2023) Sodium: normal (04/26/2023) Potassium: normal (04/26/2023) Liver Function Tests: normal (04/26/2023) LDL: 129 (04/26/2023) HDL: 93 (04/26/2023) Triglycerides: 110 (04/26/2023) C-Reactive Protein: normal (04/26/2023) Apolipoprotein B: 91 (04/26/2023)  RADIOLOGY Coronary  CT: Mild coronary calcifications, calcium score 25, minimal plaque, blockages less than 25%, tiny patent foramen ovale Risk Assessment/Calculations:             Physical Exam:   VS:  BP 94/70 (BP Location: Left Arm, Patient Position: Sitting, Cuff Size: Normal)   Pulse (!) 55   Ht 5\' 4"  (1.626 m)   Wt 137 lb 9.6 oz (62.4 kg)   LMP 05/31/2006   SpO2 97%   BMI 23.62 kg/m    Wt Readings from Last 3 Encounters:  09/20/23 137 lb 9.6 oz (62.4 kg)  06/13/23 143 lb (64.9 kg)  09/09/22 138 lb 3.2 oz (62.7 kg)     Physical Exam GENERAL: Alert, cooperative, well developed, no acute distress. HEENT: Normocephalic, normal oropharynx, moist mucous membranes. CHEST: Clear to auscultation bilaterally, no wheezes, rhonchi, or crackles. CARDIOVASCULAR: Normal heart rate and rhythm, S1 and S2 normal without murmurs. ABDOMEN: Soft, non-tender, non-distended, without organomegaly, normal bowel sounds. EXTREMITIES: No cyanosis or edema. NEUROLOGICAL: Cranial nerves grossly intact, moves all extremities without gross motor or sensory deficit.   ASSESSMENT AND PLAN: .    Assessment and Plan Assessment & Plan Coronary Artery Disease with Mild Calcification Coronary CT shows mild calcifications with <25% blockages in LAD and circumflex artery. Low risk, no contraindications for exercise. - Encourage continuation of current exercise regimen, including running a 10K. - Discuss dietary modifications to support cardiovascular health.  Elevated LDL Cholesterol, HLD LDL at 129 mg/dL, above target <60 mg/dL. Apolipoprotein B slightly elevated. She prefers dietary changes over statins. Reducing LDL <70 mg/dL can decrease plaque and events. - Implement a plant-based or Mediterranean diet to lower LDL cholesterol. - Recheck cholesterol levels in 6 to  12 months. - Encourage high fiber intake to help reduce cholesterol levels.  Patent Foramen Ovale Tiny PFO with minimal shunting, asymptomatic, low risk, no  intervention needed.  Irritable Bowel Syndrome with Diarrhea IBS with diarrhea, recent symptom exacerbation. Under care of Dr. Tova Fresh with some improvement. Stress and diet are factors. - Continue to follow dietary recommendations from Dr. Tova Fresh, including consideration of a low FODMAP diet. Reviewed heart healthy diet that would be in alignment. - Avoid supplements that may exacerbate symptoms.      Grady Lawman, MD, [facility]

## 2023-09-20 NOTE — Patient Instructions (Addendum)
 Medication Instructions:  No Changes *If you need a refill on your cardiac medications before your next appointment, please call your pharmacy*  Lab Work: None  Follow-Up: At [facility], you and your health needs are our priority.  As part of our continuing mission to provide you with exceptional heart care, our providers are all part of one team.  This team includes your primary Cardiologist (physician) and Advanced Practice Providers or APPs (Physician Assistants and Nurse Practitioners) who all work together to provide you with the care you need, when you need it.  Your next appointment:   6 months  Provider:   Gayatri A Acharya, MD    Other Instructions Please call us  or send a MyChart message with any Cardiology related questions/concerns.  [phone].  Thank you!       Valet parking services will be available as well.

## 2024-03-08 ENCOUNTER — Encounter (HOSPITAL_BASED_OUTPATIENT_CLINIC_OR_DEPARTMENT_OTHER): Payer: Self-pay | Admitting: Obstetrics & Gynecology

## 2024-11-26 ENCOUNTER — Ambulatory Visit (HOSPITAL_BASED_OUTPATIENT_CLINIC_OR_DEPARTMENT_OTHER): Payer: Self-pay | Admitting: Obstetrics & Gynecology
